# Patient Record
Sex: Female | Born: 1980
Health system: Southern US, Community
[De-identification: ages and names within clinical notes are randomized; demographics above are authoritative.]

## PROBLEM LIST (undated history)

## (undated) DIAGNOSIS — R7303 Prediabetes: Secondary | ICD-10-CM

## (undated) DIAGNOSIS — F419 Anxiety disorder, unspecified: Secondary | ICD-10-CM

## (undated) DIAGNOSIS — K760 Fatty (change of) liver, not elsewhere classified: Secondary | ICD-10-CM

## (undated) DIAGNOSIS — F32A Depression, unspecified: Secondary | ICD-10-CM

## (undated) DIAGNOSIS — O009 Unspecified ectopic pregnancy without intrauterine pregnancy: Secondary | ICD-10-CM

## (undated) DIAGNOSIS — F329 Major depressive disorder, single episode, unspecified: Secondary | ICD-10-CM

## (undated) DIAGNOSIS — E039 Hypothyroidism, unspecified: Secondary | ICD-10-CM

## (undated) DIAGNOSIS — E282 Polycystic ovarian syndrome: Secondary | ICD-10-CM

## (undated) DIAGNOSIS — E119 Type 2 diabetes mellitus without complications: Secondary | ICD-10-CM

## (undated) HISTORY — DX: Anxiety disorder, unspecified: F41.9

## (undated) HISTORY — PX: DILATION AND CURETTAGE OF UTERUS: SHX78

## (undated) HISTORY — DX: Depression, unspecified: F32.A

## (undated) HISTORY — DX: Unspecified ectopic pregnancy without intrauterine pregnancy: O00.90

## (undated) HISTORY — DX: Type 2 diabetes mellitus without complications: E11.9

## (undated) HISTORY — DX: Polycystic ovarian syndrome: E28.2

## (undated) HISTORY — DX: Hypothyroidism, unspecified: E03.9

## (undated) HISTORY — DX: Prediabetes: R73.03

## (undated) HISTORY — DX: Major depressive disorder, single episode, unspecified: F32.9

---

## 2014-07-23 DIAGNOSIS — O009 Unspecified ectopic pregnancy without intrauterine pregnancy: Secondary | ICD-10-CM

## 2014-07-23 HISTORY — DX: Unspecified ectopic pregnancy without intrauterine pregnancy: O00.90

## 2014-08-30 ENCOUNTER — Observation Stay: Payer: Self-pay | Admitting: Obstetrics & Gynecology

## 2014-08-30 LAB — URINALYSIS, COMPLETE
Bacteria: NONE SEEN
Bilirubin,UR: NEGATIVE
GLUCOSE, UR: NEGATIVE mg/dL (ref 0–75)
Ketone: NEGATIVE
LEUKOCYTE ESTERASE: NEGATIVE
NITRITE: NEGATIVE
PROTEIN: NEGATIVE
Ph: 6 (ref 4.5–8.0)
SPECIFIC GRAVITY: 1.014 (ref 1.003–1.030)
Squamous Epithelial: <1
WBC UR: <1 /HPF (ref 0–5)

## 2014-08-30 LAB — CBC
HCT: 43.2 % (ref 35.0–47.0)
HGB: 14.1 g/dL (ref 12.0–16.0)
MCH: 28.9 pg (ref 26.0–34.0)
MCHC: 32.6 g/dL (ref 32.0–36.0)
MCV: 89 fL (ref 80–100)
PLATELETS: 236 10*3/uL (ref 150–440)
RBC: 4.87 10*6/uL (ref 3.80–5.20)
RDW: 13.7 % (ref 11.5–14.5)
WBC: 12.6 10*3/uL — AB (ref 3.6–11.0)

## 2014-08-30 LAB — COMPREHENSIVE METABOLIC PANEL
ALBUMIN: 4 g/dL (ref 3.4–5.0)
ANION GAP: 7 (ref 7–16)
AST: 32 U/L (ref 15–37)
Alkaline Phosphatase: 68 U/L (ref 46–116)
BILIRUBIN TOTAL: 0.5 mg/dL (ref 0.2–1.0)
BUN: 10 mg/dL (ref 7–18)
CHLORIDE: 106 mmol/L (ref 98–107)
CREATININE: 1.06 mg/dL (ref 0.60–1.30)
Calcium, Total: 8.9 mg/dL (ref 8.5–10.1)
Co2: 24 mmol/L (ref 21–32)
EGFR (Non-African Amer.): >60
Glucose: 90 mg/dL (ref 65–99)
Osmolality: 272 (ref 275–301)
POTASSIUM: 3.8 mmol/L (ref 3.5–5.1)
SGPT (ALT): 38 U/L (ref 14–63)
Sodium: 137 mmol/L (ref 136–145)
TOTAL PROTEIN: 7.7 g/dL (ref 6.4–8.2)

## 2014-08-30 LAB — WET PREP, GENITAL

## 2014-08-30 LAB — HCG, QUANTITATIVE, PREGNANCY: Beta Hcg, Quant.: 2086 m[IU]/mL — ABNORMAL HIGH

## 2014-11-15 LAB — SURGICAL PATHOLOGY

## 2014-11-21 NOTE — Op Note (Signed)
PATIENT NAME:  Emily Banks, Emily Banks MR#:  [id number] DATE OF BIRTH:  08/25/1980  DATE OF PROCEDURE:  08/30/2014  PREOPERATIVE DIAGNOSIS:  Ectopic pregnancy.   POSTOPERATIVE DIAGNOSIS:  Ectopic pregnancy.  PROCEDURE:  Operative laparoscopy and right salpingectomy.   SURGEON:  Emily Self C. Laycie Schriner, MD   ANESTHESIA:  General endotracheal.  ESTIMATED BLOOD LOSS:  Minimal.   OPERATIVE FLUIDS:  600 mL of crystalloid.   URINE OUTPUT:  100 mL.   FINDINGS: 1.  Dilated cervical os with blood clot and tissue within, which was removed and sent to pathology.  2.  Normal-appearing uterus, bilateral ovaries, and left tube.  3.  Swelling of the right fallopian tube, mid-isthmus, consistent with ectopic pregnancy, intact.  4.  Adhesive disease in the pelvis and abdomen with omentum adherent to posterior cul-de-sac, ascending colon with filmy adhesions to the right lateral abdominal wall, and sigmoid colon with filmy adhesions to the left pelvic sidewall greater than normal physiologic.   SPECIMENS: 1.  Right tube with ectopic pregnancy.  2.  Cervical blood clot/tissue.   COMPLICATIONS:  None apparent.   DISPOSITION:  Stable to PACU for recovery.   INDICATION FOR PROCEDURE:  Emily Banks is a 34 year old G1, P0-0-0-0, whose last menstrual period was in June 25, 2014, which made her 9 weeks and 3 days by dates today. She was evaluated for initial pregnancy, vaginal bleeding and pelvic pain and over the course of the last 2 weeks was having her beta-hCG monitored. At first, the beta-hCG declined, but then it began to rise inappropriately. Today's value was 2086. She presented to the hospital with pelvic pain and ultrasound was performed. There was no definitive visualization on ultrasound of a gestational sac, fetal pole, or any confirmatory pregnancy findings in the adnexa or in the uterus. There was an abnormality in the right adnexa that was concerning for ruptured ectopic. She was counseled  extensively regarding observation versus methotrexate treatment versus surgery and ultimately surgery was recommended given her clinical scenario, and it was discussed with her that the attempt would be made to conserve the tube if possible; however, the possibility of taking the tube to avoid future scar tissue and ectopic pregnancies would be considered at the time of surgery.   Consents were signed, and the patient was brought back to the operating room, where she was given endotracheal intubation for anesthesia and prepped and draped in the usual sterile fashion. A timeout was called. The Graves speculum was placed into the vagina, visualizing the cervix with findings as above. The uterine manipulator was then inserted, and straight catheterization was performed to evacuate the bladder. The attention was then turned to the abdomen, where an 11 blade was used to incise the apex of the umbilicus, and a Veress needle was inserted. Pneumoperitoneum was created. Opening pressure was 7 mmHg and the max pressure was 15 mmHg throughout the case. The Veress needle was then removed once the pneumoperitoneum was created, and the 5 mm trocar Visiport was inserted into the same area.   Inspection of the abdominal cavity showed findings as above. A 5 mm port was then placed in each, the left lower quadrant and the right lower quadrant under visualization and without difficulty. The patient was placed in Trendelenburg. The omentum was attempted to be removed out of the pelvis; however, it was adherent on the left side of the posterior cul-de-sac, and no attempt was made to release these adhesions. On inspection, the uterus was normal-appearing. The bilateral ovaries were also normal-appearing  and plump and appropriately sized for a 34 year old normal-menstruating female. The left tube appeared to be normal, and then the right tube had a normal fimbriated end with blood coming through it and a blood clot that had formed  against the omentum that was adherent near that area. Then, within the tube isthmus, there was a dilated area consistent with an ectopic pregnancy in the fallopian tube. Cautery was used to incise the tube in a longitudinal fashion in order to attempt to tease out the gestational sac or the sac that had been identified in the tubular structure. The majority of the sac was able to be expelled; however, on final attempts to remove it from the tube, the distal end of the tube inverted and became quite disrupted. The sac itself also became disrupted once pressure had been applied and was removed from the abdominal cavity no longer intact. This tissue was collected, and the inspection of the tube was such that attempt to heal intact would likely not be possible and would be a nidus for future ectopic pregnancy. Thus, I made the decision to remove the tube in its entirety. The tube was grasped at its fimbriated end and lifted, and the mesosalpinx beneath it was ligated using the LigaSure towards the cornua. Then, it was sealed at the cornual end and then removed from the abdominal cavity intact. This was placed together with the ectopic tissue and sent off together as right tube and ectopic pregnancy. The area was hemostatic. Irrigation was performed to remove the small amount of hematoperitoneum that had already been in place, as well as the blood clot near the omentum, and the procedure was deemed complete. The laparoscopic trocars were removed after the pneumoperitoneum was deflated. The skin was then closed with 4-0 Monocryl and covered in surgical glue. The uterine manipulator was removed, and the cervical tissue was collected and sent to pathology for review to ensure that this was indeed just a blood clot and not additional pregnancy tissue.   The patient tolerated the procedure well. The counts were correct x 2, and then the patient was brought back to PACU in a stable condition for recovery.     ____________________________ Emily Banks C. Raywood Wailes, MD ccw:nb D: 08/31/2014 01:17:18 ET T: 08/31/2014 05:47:38 ET JOB#: [id number]  cc: Leeroy Bockhelsea C. Nasim Garofano, MD, <Dictator> Leola BrazilHELSEA C Khamiya Varin MD ELECTRONICALLY SIGNED 08/31/2014 12:42

## 2016-01-31 ENCOUNTER — Encounter: Payer: Self-pay | Admitting: Primary Care

## 2016-01-31 ENCOUNTER — Ambulatory Visit (INDEPENDENT_AMBULATORY_CARE_PROVIDER_SITE_OTHER): Payer: Managed Care, Other (non HMO) | Admitting: Primary Care

## 2016-01-31 VITALS — BP 124/84 | HR 64 | Temp 98.7°F | Ht 66.0 in | Wt 255.1 lb

## 2016-01-31 DIAGNOSIS — F418 Other specified anxiety disorders: Secondary | ICD-10-CM

## 2016-01-31 DIAGNOSIS — R030 Elevated blood-pressure reading, without diagnosis of hypertension: Secondary | ICD-10-CM

## 2016-01-31 DIAGNOSIS — F32A Depression, unspecified: Secondary | ICD-10-CM | POA: Insufficient documentation

## 2016-01-31 DIAGNOSIS — F329 Major depressive disorder, single episode, unspecified: Secondary | ICD-10-CM | POA: Insufficient documentation

## 2016-01-31 DIAGNOSIS — F419 Anxiety disorder, unspecified: Principal | ICD-10-CM

## 2016-01-31 HISTORY — DX: Elevated blood-pressure reading, without diagnosis of hypertension: R03.0

## 2016-01-31 MED ORDER — SERTRALINE HCL 25 MG PO TABS: 25.0000 mg | ORAL_TABLET | Freq: Every day | ORAL | Status: DC

## 2016-01-31 NOTE — Assessment & Plan Note (Signed)
Long history of for 2 years since miscarriage. Depression and anxiety attributed to difficulty conceiving children due to PCOS. Long discussion today regarding treatment options and highly recommend both oral and psychotherapy treatment as pHQ 9 score is 19 and GAD 7 score is 17 today.  Since she is not currently pregnant I believe she will benefit with the initiation of an oral medication. Referral placed for therapy. Start Zoloft 25 mg tablets. Discussed risks for birth defects which are low in the first and second trimester. She will notify me and her GYN if she becomes pregnant.  Patient is to take 1/2 tablet daily for 6 days, then advance to 1 full tablet thereafter. We discussed possible side effects of headache, GI upset, drowsiness, and SI/HI. If thoughts of SI/HI develop, we discussed to present to the emergency immediately. Patient verbalized understanding.   Follow-up in 6 weeks for reevaluation.

## 2016-01-31 NOTE — Progress Notes (Signed)
Pre visit review using our clinic review tool, if applicable. No additional management support is needed unless otherwise documented below in the visit note. 

## 2016-01-31 NOTE — Assessment & Plan Note (Signed)
1 elevated reading in April 2017. Has not checked her blood pressure since. Low pressure during today's visit is stable at 124/87. She is asymptomatic. Given family history and obesity will continue to monitor.

## 2016-01-31 NOTE — Patient Instructions (Signed)
Check with your GYN prior to starting Zoloft. I recommend treatment with this medication for now as you are not pregnant, and I believe it will help with your symptoms.  Start by taking 1/2 tablet daily for 8 days, then advance to 1 full tablet thereafter.  You will be contacted regarding your referral to Therapy.  Please let us know if you have not heard back within one week.   Your blood pressure looks great today.  I would like to see you back in 6 weeks for re-evaluation.  It was a pleasure to meet you today! Please don't hesitate to call me with any questions. Welcome to [facility]!

## 2016-01-31 NOTE — Progress Notes (Signed)
Subjective:    Patient ID: Emily HorsemanJennifer Banks, female    DOB: 16-Jul-1981, 35 y.o.   MRN: [id number]  HPI  Ms. Emily Banks is a 35 year old female who presents today to establish care and discuss the problems mentioned below. Will obtain old records.  1) Elevated Blood Pressure Reading: Elevated once at her optometrists office several months ago. BP is stable in the office today at 124/84. She's not checked her blood pressure since her optometrist visit in April. Strong family history of hypertension. Denies headaches, visual changes, dizziness, chest pain.   2) Anxiety and Depression: History of miscarriage 2 years ago and has struggled with depressive symptoms since. She is trying to become pregnant now and is working with GYN. She has a history of PCOS which has caused complications in becoming pregnant.   She experiences symptoms of fatigue, tearfulness with depressive emotions daily, has difficulty falling asleep, experiences daily worry and daily anxiety. She has a history of panic attacks in large crowds that occur once monthly. PHQ 9 score of 19 and GAD 7 score 17. Denies SI/HI. She has never been treated with medication and has never completed therapy.  Review of Systems  Eyes: Negative for visual disturbance.  Respiratory: Negative for shortness of breath.   Cardiovascular: Negative for chest pain.  Allergic/Immunologic: Positive for environmental allergies.  Neurological: Negative for dizziness and headaches.  Psychiatric/Behavioral: Positive for sleep disturbance. Negative for suicidal ideas. The patient is nervous/anxious.        Past Medical History  Diagnosis Date  . Anxiety and depression   . PCOS (polycystic ovarian syndrome)   . Borderline diabetes      Social History   Social History  . Marital Status: Married    Spouse Name: N/A  . Number of Children: N/A  . Years of Education: N/A   Occupational History  . Not on file.   Social History Main Topics  .  Smoking status: Former Games developermoker  . Smokeless tobacco: Not on file  . Alcohol Use: 0.0 oz/week    0 Standard drinks or equivalent per week     Comment: soical  . Drug Use: Not on file  . Sexual Activity: Not on file   Other Topics Concern  . Not on file   Social History Narrative   Married.   No children.   Works at [employer].   Enjoys playing games, spending time with family.    Past Surgical History  Procedure Laterality Date  . Dilation and curettage of uterus      Family History  Problem Relation Age of Onset  . Diabetes Father   . Hypertension Father   . Heart disease Father   . Hyperlipidemia Mother   . Diabetes Brother   . Hypertension Brother   . Melanoma Father   . Ovarian cancer Paternal Aunt   . Esophageal cancer Maternal Grandmother     No Known Allergies  No current outpatient prescriptions on file prior to visit.   No current facility-administered medications on file prior to visit.    BP 124/84 mmHg  Pulse 64  Temp(Src) 98.7 F (37.1 C) (Oral)  Ht 5\' 6"  (1.676 m)  Wt 255 lb 1.9 oz (115.722 kg)  BMI 41.20 kg/m2  SpO2 98%    Objective:   Physical Exam  Constitutional: She appears well-nourished.  Neck: Neck supple.  Cardiovascular: Normal rate and regular rhythm.   Pulmonary/Chest: Effort normal and breath sounds normal.  Skin: Skin is warm and  dry.  Psychiatric: She has a normal mood and affect.  Slightly tearful during examination when discussing depression and anxiety. Depression and anxiety uncontrolled, needs treatment.          Assessment & Plan:

## 2016-03-13 ENCOUNTER — Ambulatory Visit: Payer: Managed Care, Other (non HMO) | Admitting: Primary Care

## 2016-03-13 ENCOUNTER — Telehealth: Payer: Self-pay | Admitting: Primary Care

## 2016-03-13 DIAGNOSIS — Z0289 Encounter for other administrative examinations: Secondary | ICD-10-CM

## 2016-03-13 NOTE — Telephone Encounter (Signed)
Please reschedule patient at her convenience.

## 2016-03-13 NOTE — Telephone Encounter (Signed)
Left message for pt to reschedule appt that was missed

## 2016-03-13 NOTE — Telephone Encounter (Signed)
Patient did not come in for their appointment today for 6 week follow up.  Please let me know if patient needs to be contacted immediately for follow up or no follow up needed. °

## 2016-03-21 ENCOUNTER — Encounter: Payer: Self-pay | Admitting: Primary Care

## 2016-03-21 NOTE — Telephone Encounter (Signed)
Sent letter to pt to rs appt  °

## 2016-03-25 ENCOUNTER — Other Ambulatory Visit: Payer: Self-pay | Admitting: Primary Care

## 2016-03-25 DIAGNOSIS — F329 Major depressive disorder, single episode, unspecified: Secondary | ICD-10-CM

## 2016-03-25 DIAGNOSIS — F419 Anxiety disorder, unspecified: Principal | ICD-10-CM

## 2016-03-25 DIAGNOSIS — F32A Depression, unspecified: Secondary | ICD-10-CM

## 2016-03-27 NOTE — Telephone Encounter (Signed)
Electronic refill request.  NP on 01/31/16.  No Showed for FU on 03/13/16.  Please advise concerning refill.

## 2016-03-28 NOTE — Telephone Encounter (Signed)
Sent.  Needs to schedule f/u.

## 2016-04-02 ENCOUNTER — Encounter: Payer: Self-pay | Admitting: Primary Care

## 2016-05-07 ENCOUNTER — Other Ambulatory Visit: Payer: Self-pay | Admitting: Family Medicine

## 2016-05-07 DIAGNOSIS — F32A Depression, unspecified: Secondary | ICD-10-CM

## 2016-05-07 DIAGNOSIS — F419 Anxiety disorder, unspecified: Principal | ICD-10-CM

## 2016-05-07 DIAGNOSIS — F329 Major depressive disorder, single episode, unspecified: Secondary | ICD-10-CM

## 2016-05-09 ENCOUNTER — Ambulatory Visit (INDEPENDENT_AMBULATORY_CARE_PROVIDER_SITE_OTHER): Payer: Managed Care, Other (non HMO) | Admitting: Primary Care

## 2016-05-09 ENCOUNTER — Encounter: Payer: Self-pay | Admitting: Primary Care

## 2016-05-09 VITALS — BP 122/78 | HR 50 | Temp 98.2°F | Ht 66.0 in | Wt 262.4 lb

## 2016-05-09 DIAGNOSIS — F418 Other specified anxiety disorders: Secondary | ICD-10-CM

## 2016-05-09 DIAGNOSIS — F32A Depression, unspecified: Secondary | ICD-10-CM

## 2016-05-09 DIAGNOSIS — F329 Major depressive disorder, single episode, unspecified: Secondary | ICD-10-CM

## 2016-05-09 DIAGNOSIS — F419 Anxiety disorder, unspecified: Principal | ICD-10-CM

## 2016-05-09 MED ORDER — SERTRALINE HCL 50 MG PO TABS: 50.0000 mg | ORAL_TABLET | Freq: Every day | ORAL | 1 refills | Status: DC

## 2016-05-09 NOTE — Progress Notes (Signed)
   Subjective:    Patient ID: Emily HorsemanJennifer Bebout, female    DOB: 07-Jul-1981, 35 y.o.   MRN: [id number]  HPI  [name] is a 35 year old female who presents today for follow up of anxiety and depression. She presented as a new patient in July 2017 with complaints of depressive symptoms since her miscarriages and given her inability to become pregnant. Her PHQ 9 score was 19 and GAD 7 score of 17 that day. She was initiated on Zoloft 25 mg and sent to therapy for further treatment.   Since her last visit she's noticed improvement overall on Zoloft, she's not scheduled a visit with therapy. She's noticed positive effects from Zoloft including  decrease in anxiety and improvement in her mood. She does continues to experience some anxiety, sadness, and tearfulness. She believes she would benefit from an increase in her dose. She denies suicidal thoughts, GI upset, headaches.    Review of Systems  Respiratory: Negative for shortness of breath.   Cardiovascular: Negative for chest pain.  Gastrointestinal: Negative for nausea.  Psychiatric/Behavioral: Negative for sleep disturbance and suicidal ideas. The patient is nervous/anxious.        Depression       Past Medical History:  Diagnosis Date  . Anxiety and depression   . Borderline diabetes   . PCOS (polycystic ovarian syndrome)      Social History   Social History  . Marital status: Married    Spouse name: N/A  . Number of children: N/A  . Years of education: N/A   Occupational History  . Not on file.   Social History Main Topics  . Smoking status: Former Games developermoker  . Smokeless tobacco: Not on file  . Alcohol use 0.0 oz/week     Comment: soical  . Drug use: Unknown  . Sexual activity: Not on file   Other Topics Concern  . Not on file   Social History Narrative   Married.   No children.   Works at [employer].   Enjoys playing games, spending time with family.    Past Surgical History:  Procedure Laterality Date    . DILATION AND CURETTAGE OF UTERUS      Family History  Problem Relation Age of Onset  . Diabetes Father   . Hypertension Father   . Heart disease Father   . Hyperlipidemia Mother   . Diabetes Brother   . Hypertension Brother   . Melanoma Father   . Ovarian cancer Paternal Aunt   . Esophageal cancer Maternal Grandmother     No Known Allergies  No current outpatient prescriptions on file prior to visit.   No current facility-administered medications on file prior to visit.     BP 122/78   Pulse (!) 50   Temp 98.2 F (36.8 C) (Oral)   Ht 5\' 6"  (1.676 m)   Wt 262 lb 6.4 oz (119 kg)   LMP 05/07/2016   SpO2 98%   BMI 42.35 kg/m    Objective:   Physical Exam  Constitutional: She appears well-nourished.  Cardiovascular: Normal rate and regular rhythm.   Pulmonary/Chest: Effort normal and breath sounds normal.  Skin: Skin is warm and dry.  Psychiatric: She has a normal mood and affect.  Much improved mood since last visit          Assessment & Plan:

## 2016-05-09 NOTE — Assessment & Plan Note (Signed)
Improved overall on Zoloft 25 mg, does experience symptoms of anxiety and depression. Will increase dose to 50 mg once daily. Cautioned her to notify her GYN if she does become pregnant. Recommended she follow with therapy as initially recommended.

## 2016-05-09 NOTE — Patient Instructions (Signed)
We've increased your dose of Zoloft to 50 mg. Take 1 tablet by mouth once daily.   You received a tetanus vaccination which will cover you for 10 years.  I will be in touch with you in 4 weeks to catch up.  It was a pleasure to see you today!

## 2016-05-09 NOTE — Progress Notes (Signed)
Pre visit review using our clinic review tool, if applicable. No additional management support is needed unless otherwise documented below in the visit note. 

## 2016-06-06 ENCOUNTER — Telehealth: Payer: Self-pay | Admitting: Primary Care

## 2016-06-06 NOTE — Telephone Encounter (Signed)
-----   Message from Doreene NestKatherine K Channon Ambrosini, NP sent at 05/09/2016  2:56 PM EDT ----- Regarding: Anxiety and Depression Please check on patient. How's she doing since we increased her Zoloft to 50 mg?

## 2016-06-06 NOTE — Telephone Encounter (Signed)
Message left for patient to return my call.  

## 2016-06-07 NOTE — Telephone Encounter (Signed)
Left message on patient's voicemail to return call

## 2016-06-12 NOTE — Telephone Encounter (Signed)
Sent patient a message thru MyChart 

## 2016-06-18 NOTE — Telephone Encounter (Signed)
Message left for patient to return my call.  

## 2016-12-04 ENCOUNTER — Other Ambulatory Visit: Payer: Self-pay | Admitting: Primary Care

## 2016-12-04 DIAGNOSIS — F419 Anxiety disorder, unspecified: Principal | ICD-10-CM

## 2016-12-04 DIAGNOSIS — F329 Major depressive disorder, single episode, unspecified: Secondary | ICD-10-CM

## 2017-03-13 ENCOUNTER — Other Ambulatory Visit: Payer: Self-pay | Admitting: Primary Care

## 2017-03-13 DIAGNOSIS — F329 Major depressive disorder, single episode, unspecified: Secondary | ICD-10-CM

## 2017-03-13 DIAGNOSIS — F419 Anxiety disorder, unspecified: Principal | ICD-10-CM

## 2017-03-13 NOTE — Telephone Encounter (Signed)
Last refill from 12/04/16... Pt never returned call or email giving update... Did you want to try to contact pt again or refill? Please advise

## 2017-03-13 NOTE — Telephone Encounter (Signed)
Message left for patient to return my call.  

## 2017-03-13 NOTE — Telephone Encounter (Signed)
No refill. Please have her schedule an appointment for refills since we never heard back from her last year.

## 2017-03-18 NOTE — Telephone Encounter (Signed)
Message left for patient to return my call.  Sending letter for patient requesting office visit for any more refills

## 2017-04-09 ENCOUNTER — Ambulatory Visit (INDEPENDENT_AMBULATORY_CARE_PROVIDER_SITE_OTHER): Payer: Managed Care, Other (non HMO) | Admitting: Primary Care

## 2017-04-09 ENCOUNTER — Encounter: Payer: Self-pay | Admitting: Primary Care

## 2017-04-09 DIAGNOSIS — R030 Elevated blood-pressure reading, without diagnosis of hypertension: Secondary | ICD-10-CM

## 2017-04-09 DIAGNOSIS — G5603 Carpal tunnel syndrome, bilateral upper limbs: Secondary | ICD-10-CM | POA: Diagnosis not present

## 2017-04-09 DIAGNOSIS — G5600 Carpal tunnel syndrome, unspecified upper limb: Secondary | ICD-10-CM

## 2017-04-09 DIAGNOSIS — F419 Anxiety disorder, unspecified: Secondary | ICD-10-CM | POA: Diagnosis not present

## 2017-04-09 DIAGNOSIS — F329 Major depressive disorder, single episode, unspecified: Secondary | ICD-10-CM | POA: Diagnosis not present

## 2017-04-09 HISTORY — DX: Carpal tunnel syndrome, unspecified upper limb: G56.00

## 2017-04-09 MED ORDER — SERTRALINE HCL 50 MG PO TABS: 50.0000 mg | ORAL_TABLET | Freq: Every day | ORAL | 3 refills | Status: DC

## 2017-04-09 NOTE — Assessment & Plan Note (Signed)
Doing well on Zoloft, denies SI/HI. Refills sent to pharmacy.

## 2017-04-09 NOTE — Patient Instructions (Signed)
Continue sertraline (Zoloft) 50 mg tablets.  Try the "cock-up" splints when typing. Consider wearing braces at night to prevent compression and to offer support.  Follow up in 1 year for re-evaluation.  It was a pleasure to see you today!

## 2017-04-09 NOTE — Assessment & Plan Note (Signed)
Highly suspect carpal tunnel to be etiology of symptoms. Discussed use of cock up splints, braces at night. Continue to monitor.

## 2017-04-09 NOTE — Progress Notes (Signed)
Subjective:    Patient ID: Emily Banks, female    DOB: 1981/06/27, 36 y.o.   MRN: [id number]  HPI  Ms. Brunelli is a 36 year old female with a history of anxiety and depression who presents today for medication refill.  She is currently managed on sertraline 50 mg for which she's been taking since July 2017. She has continued to feel very well on Zoloft since initiation. She's continued to notice feeling more calm during stressful situations, significantly less tearfulness, feeling less depressed. She denies SI/HI, headaches, nausea, GI upset.   2) Extremity Numbness: Located to bilateral upper extremities that starts to the fingers and hands, through the wrists with radiation up to the upper forearm. This has been present for several months. She works typing on a computer all day, also works in Estate agent and uses an Statistician. She will wake up in the morning with moderate numbness with discomfort. She denies neck and shoulder pain, weakness, color changes to skin.  Review of Systems  Musculoskeletal: Negative for arthralgias and neck pain.  Skin: Negative for color change.  Neurological: Positive for numbness. Negative for weakness.  Psychiatric/Behavioral: Negative for suicidal ideas. The patient is not nervous/anxious.        See HPI       Past Medical History:  Diagnosis Date  . Anxiety and depression   . Borderline diabetes   . PCOS (polycystic ovarian syndrome)      Social History   Social History  . Marital status: Married    Spouse name: N/A  . Number of children: N/A  . Years of education: N/A   Occupational History  . Not on file.   Social History Main Topics  . Smoking status: Former Games developer  . Smokeless tobacco: Never Used  . Alcohol use 0.0 oz/week     Comment: soical  . Drug use: Unknown  . Sexual activity: Not on file   Other Topics Concern  . Not on file   Social History Narrative   Married.   No children.   Works at [employer].   Enjoys playing games, spending time with family.    Past Surgical History:  Procedure Laterality Date  . DILATION AND CURETTAGE OF UTERUS      Family History  Problem Relation Age of Onset  . Diabetes Father   . Hypertension Father   . Heart disease Father   . Hyperlipidemia Mother   . Diabetes Brother   . Hypertension Brother   . Melanoma Father   . Ovarian cancer Paternal Aunt   . Esophageal cancer Maternal Grandmother     No Known Allergies  No current outpatient prescriptions on file prior to visit.   No current facility-administered medications on file prior to visit.     BP 118/74   Pulse 67   Temp 98 F (36.7 C) (Oral)   Ht  (1.676 m)   Wt 273 lb 12.8 oz (124.2 kg)   SpO2 97%   BMI 44.19 kg/m    Objective:   Physical Exam  Constitutional: She appears well-nourished.  Neck: Neck supple.  Cardiovascular: Normal rate and regular rhythm.   Pulmonary/Chest: Effort normal and breath sounds normal.  Musculoskeletal:       Right shoulder: She exhibits normal range of motion and no pain.       Left shoulder: She exhibits normal range of motion and no pain.       Cervical back: She exhibits normal range  of motion.  5/5 strength to bilateral upper extremities.  Neurological:  Negative phalen's and tinnel's sign.  Skin: Skin is warm and dry. No erythema.          Assessment & Plan:

## 2017-04-09 NOTE — Assessment & Plan Note (Signed)
BP stable in the office today. Continue to monitor.

## 2017-09-03 ENCOUNTER — Encounter: Payer: Self-pay | Admitting: Primary Care

## 2017-09-03 ENCOUNTER — Ambulatory Visit: Payer: 59 | Admitting: Primary Care

## 2017-09-03 VITALS — BP 118/80 | HR 78 | Temp 98.0°F | Ht 66.0 in | Wt 278.1 lb

## 2017-09-03 DIAGNOSIS — F329 Major depressive disorder, single episode, unspecified: Secondary | ICD-10-CM

## 2017-09-03 DIAGNOSIS — M79662 Pain in left lower leg: Secondary | ICD-10-CM | POA: Diagnosis not present

## 2017-09-03 DIAGNOSIS — F419 Anxiety disorder, unspecified: Secondary | ICD-10-CM

## 2017-09-03 LAB — VITAMIN B12: Vitamin B-12: 332 pg/mL (ref 211–911)

## 2017-09-03 LAB — TSH: TSH: 3.68 u[IU]/mL (ref 0.35–4.50)

## 2017-09-03 LAB — BASIC METABOLIC PANEL
BUN: 10 mg/dL (ref 6–23)
CHLORIDE: 104 meq/L (ref 96–112)
CO2: 25 mEq/L (ref 19–32)
CREATININE: 1 mg/dL (ref 0.40–1.20)
Calcium: 9.2 mg/dL (ref 8.4–10.5)
GFR: 66.35 mL/min (ref >60.00)
Glucose, Bld: 92 mg/dL (ref 70–99)
Potassium: 4.1 mEq/L (ref 3.5–5.1)
Sodium: 138 mEq/L (ref 135–145)

## 2017-09-03 MED ORDER — SERTRALINE HCL 100 MG PO TABS: 100.0000 mg | ORAL_TABLET | Freq: Every day | ORAL | 0 refills | Status: DC

## 2017-09-03 NOTE — Progress Notes (Signed)
Subjective:    Patient ID: Emily Banks, female    DOB: [birth year], 37 y.o.   MRN: [id number]  HPI  Emily Banks is a 37 year old female with a history of anxiety and depression, carpal tunnel syndrome, obesity who presents today with multiple complaints.  1) Lower Extremity Pain: Located to left lateral lower leg from the upper calf with radiation down to her left foot. Pain is intermittent (1-2 times monthly) and will occur mostly when applying weight such as walking, walking upstairs, or turning. She denies swelling, redness, recent injury or trauma, long travel, back pain, numbness/tingling. She describes her pain as a sharp, shooting pain with weakness that will last for seconds to minutes. Symptoms have been present for the past one year and seems like symptoms are more frequent. She does not smoke, she is not on birth control. She does not exercise.   2) Anxiety: Currently managed on Zoloft 50 mg which "takes the edge off". Over the past one year she feels this "rush" of anxiety that she'll feel to her throat. This will last a few seconds then will dissipate. Symptoms are very random and will occur with rest and mild exertion. She denies burning, belching, epigastric pain, chest pain.   GAD 7 score of 17 today. She denies SI/HI.   Review of Systems  Constitutional: Negative for fever and unexpected weight change.  HENT: Negative for trouble swallowing.   Musculoskeletal: Negative for back pain.       Left lower extremity pain  Neurological: Negative for numbness.  Psychiatric/Behavioral: The patient is nervous/anxious.        See HPI       Past Medical History:  Diagnosis Date  . Anxiety and depression   . Borderline diabetes   . PCOS (polycystic ovarian syndrome)      Social History   Socioeconomic History  . Marital status: Married    Spouse name: Not on file  . Number of children: Not on file  . Years of education: Not on file  . Highest education level: Not  on file  Social Needs  . Financial resource strain: Not on file  . Food insecurity - worry: Not on file  . Food insecurity - inability: Not on file  . Transportation needs - medical: Not on file  . Transportation needs - non-medical: Not on file  Occupational History  . Not on file  Tobacco Use  . Smoking status: Former Games developer  . Smokeless tobacco: Never Used  Substance and Sexual Activity  . Alcohol use: Yes    Alcohol/week: 0.0 oz    Comment: soical  . Drug use: Not on file  . Sexual activity: Not on file  Other Topics Concern  . Not on file  Social History Narrative   Married.   No children.   Works at [employer].   Enjoys playing games, spending time with family.     Family History  Problem Relation Age of Onset  . Diabetes Father   . Hypertension Father   . Heart disease Father   . Hyperlipidemia Mother   . Diabetes Brother   . Hypertension Brother   . Melanoma Father   . Ovarian cancer Paternal Aunt   . Esophageal cancer Maternal Grandmother     No Known Allergies  Current Outpatient Medications on File Prior to Visit  Medication Sig Dispense Refill  . sertraline (ZOLOFT) 50 MG tablet Take 1 tablet (50 mg total) by mouth daily. 90 tablet 3  No current facility-administered medications on file prior to visit.     BP 118/80   Pulse 78   Temp 98 F (36.7 C) (Oral)   Ht 5\' 6"  (1.676 m)   Wt 278 lb 1.9 oz (126.2 kg)   SpO2 97%   BMI 44.89 kg/m    Objective:   Physical Exam  Constitutional: She appears well-nourished.  Neck: Neck supple.  Cardiovascular: Normal rate and regular rhythm.  Pulmonary/Chest: Effort normal and breath sounds normal.  Musculoskeletal:       Legs: No tenderness to left lower extremity   Skin: Skin is warm and dry. No erythema.  Psychiatric: She has a normal mood and affect.          Assessment & Plan:  Calf Pain:  Intermittent for years. No evidence or risk factors for DVT, exam today not consistent for  DVT. Given duration of symptoms will rule out DVT with ultrasound. Also check labs including BMP and B12. Discussed proper water consumption.  Doreene NestKatherine K Georgeann Brinkman, NP

## 2017-09-03 NOTE — Assessment & Plan Note (Signed)
GAD 7 score of 17 today, Zoloft seems to be helping but perhaps not at the correct dose. Increase Zoloft to 100 mg, new Rx sent to pharmacy.  She will update in 4 weeks. Avoid benzos as this is not best practice for daily anxiety symptoms.

## 2017-09-03 NOTE — Patient Instructions (Signed)
We've increased your sertraline (Zoloft) from 50 mg to 100 mg. You may take two of your 50 mg tablets to equal 100 mg until your bottle is empty.  Please send me an update in 4 weeks via My Chart.  You will be contacted regarding your ultrasound.  Please let us know if you have not been contacted within one week.   Stop by the lab prior to leaving today. I will notify you of your results once received.   It was a pleasure to see you today!

## 2017-09-10 ENCOUNTER — Ambulatory Visit
Admission: RE | Admit: 2017-09-10 | Discharge: 2017-09-10 | Disposition: A | Discharge status: Home / Self Care | Payer: 59 | Source: Ambulatory Visit | Attending: Primary Care | Admitting: Primary Care

## 2017-09-10 DIAGNOSIS — M79662 Pain in left lower leg: Secondary | ICD-10-CM | POA: Diagnosis not present

## 2017-10-07 ENCOUNTER — Other Ambulatory Visit: Payer: Self-pay | Admitting: Primary Care

## 2017-10-07 DIAGNOSIS — F329 Major depressive disorder, single episode, unspecified: Secondary | ICD-10-CM

## 2017-10-07 DIAGNOSIS — F419 Anxiety disorder, unspecified: Principal | ICD-10-CM

## 2018-02-17 ENCOUNTER — Other Ambulatory Visit: Payer: Self-pay | Admitting: Primary Care

## 2018-02-17 DIAGNOSIS — F329 Major depressive disorder, single episode, unspecified: Secondary | ICD-10-CM

## 2018-02-17 DIAGNOSIS — F419 Anxiety disorder, unspecified: Principal | ICD-10-CM

## 2018-06-23 ENCOUNTER — Other Ambulatory Visit: Payer: Self-pay | Admitting: Primary Care

## 2018-06-23 DIAGNOSIS — F329 Major depressive disorder, single episode, unspecified: Secondary | ICD-10-CM

## 2018-06-23 DIAGNOSIS — F419 Anxiety disorder, unspecified: Principal | ICD-10-CM

## 2018-06-23 NOTE — Telephone Encounter (Signed)
Please call patient: How's she doing on Zoloft 100 mg for anxiety? Also will need office visit follow up for February 2020, please schedule. Okay to do CPE.

## 2018-06-23 NOTE — Telephone Encounter (Signed)
Last office visit 09/03/2017 for calf pain & anxiety.  AVS states to update Emily Banks in 4 weeks with dosage change.  I do not see an update.  No future appointment.  Ok to refill?

## 2018-06-24 ENCOUNTER — Other Ambulatory Visit: Payer: Self-pay | Admitting: Primary Care

## 2018-06-24 DIAGNOSIS — F32A Depression, unspecified: Secondary | ICD-10-CM

## 2018-06-24 DIAGNOSIS — F329 Major depressive disorder, single episode, unspecified: Secondary | ICD-10-CM

## 2018-06-24 DIAGNOSIS — F419 Anxiety disorder, unspecified: Principal | ICD-10-CM

## 2018-06-25 NOTE — Telephone Encounter (Signed)
Message left for patient to return my call.  

## 2018-06-25 NOTE — Telephone Encounter (Deleted)
Message left for patient to return my call.  

## 2018-07-01 NOTE — Telephone Encounter (Signed)
Message left for patient to return my call.  

## 2018-07-18 ENCOUNTER — Other Ambulatory Visit: Payer: Self-pay

## 2018-07-18 DIAGNOSIS — F32A Depression, unspecified: Secondary | ICD-10-CM

## 2018-07-18 DIAGNOSIS — F329 Major depressive disorder, single episode, unspecified: Secondary | ICD-10-CM

## 2018-07-18 DIAGNOSIS — F419 Anxiety disorder, unspecified: Principal | ICD-10-CM

## 2018-07-18 MED ORDER — SERTRALINE HCL 100 MG PO TABS: 100.0000 mg | ORAL_TABLET | Freq: Every day | ORAL | 0 refills | Status: DC

## 2018-07-18 NOTE — Telephone Encounter (Signed)
Send patient a message through MyChart. 

## 2018-08-12 ENCOUNTER — Telehealth: Payer: Self-pay

## 2018-08-12 NOTE — Telephone Encounter (Addendum)
Unable to reach pt at either contact #. Left v/m for pt to cb to [facility]. FYI to Fife at front desk.

## 2018-08-12 NOTE — Telephone Encounter (Signed)
I sent email for pt to call back or schedule via Mychart.

## 2018-08-12 NOTE — Telephone Encounter (Signed)
[facility] Night - Client Nonclinical Telephone Record [facility] Client [facility] Night - Client Client Site  [facility] - Night Physician Vernona Rieger - NP Contact Type Call Who Is Calling Patient / Member / Family / Caregiver Caller Name Briania Gullett Caller Phone Number [phone] Patient Name Emily Banks Patient DOB 30-Jan-1981 Call Type Message Only Information Provided Reason for Call Request to Schedule Office Appointment Initial Comment [name] She needs to talk to her about her carpel tunnel. No symptoms reported and the option to speak with an afterhours nurse was declined. Additional Comment Call Closed By: Majel Homer Transaction Date/Time: 08/11/2018 5:48:08 PM (ET)

## 2018-08-14 ENCOUNTER — Other Ambulatory Visit (INDEPENDENT_AMBULATORY_CARE_PROVIDER_SITE_OTHER): Payer: No Typology Code available for payment source

## 2018-08-14 ENCOUNTER — Encounter: Payer: Self-pay | Admitting: Primary Care

## 2018-08-14 ENCOUNTER — Ambulatory Visit: Payer: No Typology Code available for payment source | Admitting: Primary Care

## 2018-08-14 VITALS — BP 122/84 | HR 72 | Temp 98.6°F | Ht 66.0 in | Wt 280.8 lb

## 2018-08-14 DIAGNOSIS — G5603 Carpal tunnel syndrome, bilateral upper limbs: Secondary | ICD-10-CM | POA: Diagnosis not present

## 2018-08-14 DIAGNOSIS — N289 Disorder of kidney and ureter, unspecified: Secondary | ICD-10-CM

## 2018-08-14 DIAGNOSIS — R030 Elevated blood-pressure reading, without diagnosis of hypertension: Secondary | ICD-10-CM

## 2018-08-14 DIAGNOSIS — R202 Paresthesia of skin: Secondary | ICD-10-CM

## 2018-08-14 DIAGNOSIS — F419 Anxiety disorder, unspecified: Secondary | ICD-10-CM

## 2018-08-14 DIAGNOSIS — F329 Major depressive disorder, single episode, unspecified: Secondary | ICD-10-CM

## 2018-08-14 DIAGNOSIS — R200 Anesthesia of skin: Secondary | ICD-10-CM

## 2018-08-14 LAB — COMPREHENSIVE METABOLIC PANEL
ALBUMIN: 4.5 g/dL (ref 3.5–5.2)
ALT: 49 U/L — ABNORMAL HIGH (ref 0–35)
AST: 33 U/L (ref 0–37)
Alkaline Phosphatase: 69 U/L (ref 39–117)
BUN: 10 mg/dL (ref 6–23)
CALCIUM: 9.4 mg/dL (ref 8.4–10.5)
CHLORIDE: 106 meq/L (ref 96–112)
CO2: 24 meq/L (ref 19–32)
CREATININE: 1.04 mg/dL (ref 0.40–1.20)
GFR: 59.36 mL/min — AB (ref >60.00)
Glucose, Bld: 136 mg/dL — ABNORMAL HIGH (ref 70–99)
POTASSIUM: 4.3 meq/L (ref 3.5–5.1)
Sodium: 137 mEq/L (ref 135–145)
Total Bilirubin: 0.5 mg/dL (ref 0.2–1.2)
Total Protein: 7.4 g/dL (ref 6.0–8.3)

## 2018-08-14 LAB — CBC
HEMATOCRIT: 42.9 % (ref 36.0–46.0)
HEMOGLOBIN: 14.5 g/dL (ref 12.0–15.0)
MCHC: 33.8 g/dL (ref 30.0–36.0)
MCV: 87.4 fl (ref 78.0–100.0)
PLATELETS: 307 10*3/uL (ref 150.0–400.0)
RBC: 4.91 Mil/uL (ref 3.87–5.11)
RDW: 13.4 % (ref 11.5–15.5)
WBC: 9.7 10*3/uL (ref 4.0–10.5)

## 2018-08-14 LAB — HEMOGLOBIN A1C: Hgb A1c MFr Bld: 6.4 % (ref 4.6–6.5)

## 2018-08-14 LAB — VITAMIN B12: VITAMIN B 12: 383 pg/mL (ref 211–911)

## 2018-08-14 MED ORDER — SERTRALINE HCL 100 MG PO TABS: 100.0000 mg | ORAL_TABLET | Freq: Every day | ORAL | 3 refills | Status: DC

## 2018-08-14 NOTE — Progress Notes (Unsigned)
a1c

## 2018-08-14 NOTE — Progress Notes (Signed)
Subjective:    Patient ID: Emily HorsemanJennifer Banks, female    DOB: [birth year], 38 y.o.   MRN: [id number]  HPI  Ms. Emily Banks is a 38 year old female who presents today for follow up.  1) Anxiety and Depression: Currently managed on Zoloft 100 mg daily. Overall doing very well on Zoloft 100 mg daily. She denies SI/HI.   BP Readings from Last 3 Encounters:  08/14/18 122/84  09/03/17 118/80  04/09/17 118/74   2) Carpal Tunnel: Chronic pain with numbness/tingling to bilateral upper extremities, mostly to the bilateral wrists and forearms. This has been present for several years. She will notice a shock like sensation at times, also with some weakness with grasping/sqeezing objects. She's tried wearing cock up splints at night which has helped with numbness/tinlging at night. She cannot wear splints at work due to her job responsibilities and activities.   Over the last one year she's noticed her symptoms are worse, now waking her up at night. She denies neck pain, injury. She's ready for further evaluation.    Review of Systems  Musculoskeletal: Positive for arthralgias.  Skin: Negative for color change.  Neurological: Positive for weakness and numbness.       Past Medical History:  Diagnosis Date  . Anxiety and depression   . Borderline diabetes   . PCOS (polycystic ovarian syndrome)      Social History   Socioeconomic History  . Marital status: Married    Spouse name: Not on file  . Number of children: Not on file  . Years of education: Not on file  . Highest education level: Not on file  Occupational History  . Not on file  Social Needs  . Financial resource strain: Not on file  . Food insecurity:    Worry: Not on file    Inability: Not on file  . Transportation needs:    Medical: Not on file    Non-medical: Not on file  Tobacco Use  . Smoking status: Former Games developermoker  . Smokeless tobacco: Never Used  Substance and Sexual Activity  . Alcohol use: Yes    Alcohol/week:  0.0 standard drinks    Comment: soical  . Drug use: Not on file  . Sexual activity: Not on file  Lifestyle  . Physical activity:    Days per week: Not on file    Minutes per session: Not on file  . Stress: Not on file  Relationships  . Social connections:    Talks on phone: Not on file    Gets together: Not on file    Attends religious service: Not on file    Active member of club or organization: Not on file    Attends meetings of clubs or organizations: Not on file    Relationship status: Not on file  . Intimate partner violence:    Fear of current or ex partner: Not on file    Emotionally abused: Not on file    Physically abused: Not on file    Forced sexual activity: Not on file  Other Topics Concern  . Not on file  Social History Narrative   Married.   No children.   Works at [employer].   Enjoys playing games, spending time with family.      Family History  Problem Relation Age of Onset  . Diabetes Father   . Hypertension Father   . Heart disease Father   . Hyperlipidemia Mother   . Diabetes Brother   . Hypertension  Brother   . Melanoma Father   . Ovarian cancer Paternal Aunt   . Esophageal cancer Maternal Grandmother     No Known Allergies  No current outpatient medications on file prior to visit.   No current facility-administered medications on file prior to visit.     BP 122/84   Pulse 72   Temp 98.6 F (37 C) (Oral)   Ht 5\' 6"  (1.676 m)   Wt 280 lb 12 oz (127.3 kg)   LMP 07/23/2018 (Within Days)   SpO2 97%   BMI 45.31 kg/m    Objective:   Physical Exam  Constitutional: She appears well-nourished.  Neck: Neck supple.  Cardiovascular: Normal rate and regular rhythm.  Pulses:      Radial pulses are 2+ on the right side and 2+ on the left side.  Respiratory: Effort normal and breath sounds normal.  Musculoskeletal:     Right wrist: She exhibits normal range of motion and no tenderness.     Left wrist: She exhibits normal range of  motion and no tenderness.       Arms:  Neurological:  Negative Phalen's/Tineal's signs  Skin: Skin is warm and dry. No erythema.           Assessment & Plan:

## 2018-08-14 NOTE — Assessment & Plan Note (Signed)
Symptoms continue to sounds characteristic of carpal tunnel syndrome.  She does experience some relief with cock up splints but cannot wear them throughout the day. Neuro exam unremarkable. She has great circulation.  Check B12, CBC, BMP today to rule out metabolic cause. Referral placed to hand surgery for evaluation.

## 2018-08-14 NOTE — Patient Instructions (Signed)
Stop by the lab prior to leaving today. I will notify you of your results once received.   You will be contacted regarding your referral to the orthopedist regarding your presumed carpal tunnel syndrome.  Please let us know if you have not been contacted within one week.   I sent refills for sertraline (Zoloft) to the pharmacy.  It was a pleasure to see you today!

## 2018-08-14 NOTE — Assessment & Plan Note (Signed)
Normal in the office today, continue to monitor.  

## 2018-08-14 NOTE — Assessment & Plan Note (Signed)
Doing well on Zoloft 100 mg, denies SI/HI. Refills sent to pharmacy.

## 2018-08-24 ENCOUNTER — Other Ambulatory Visit: Payer: Self-pay | Admitting: Primary Care

## 2018-08-24 DIAGNOSIS — N289 Disorder of kidney and ureter, unspecified: Secondary | ICD-10-CM

## 2018-08-28 ENCOUNTER — Other Ambulatory Visit (INDEPENDENT_AMBULATORY_CARE_PROVIDER_SITE_OTHER): Payer: No Typology Code available for payment source

## 2018-08-28 ENCOUNTER — Other Ambulatory Visit: Payer: Self-pay | Admitting: Primary Care

## 2018-08-28 DIAGNOSIS — N289 Disorder of kidney and ureter, unspecified: Secondary | ICD-10-CM

## 2018-08-28 DIAGNOSIS — R7303 Prediabetes: Secondary | ICD-10-CM

## 2018-08-28 LAB — BASIC METABOLIC PANEL
BUN: 9 mg/dL (ref 6–23)
CALCIUM: 9.2 mg/dL (ref 8.4–10.5)
CO2: 26 meq/L (ref 19–32)
Chloride: 105 mEq/L (ref 96–112)
Creatinine, Ser: 1 mg/dL (ref 0.40–1.20)
GFR: 62.1 mL/min (ref >60.00)
Glucose, Bld: 186 mg/dL — ABNORMAL HIGH (ref 70–99)
Potassium: 3.9 mEq/L (ref 3.5–5.1)
Sodium: 136 mEq/L (ref 135–145)

## 2018-09-05 ENCOUNTER — Telehealth: Payer: Self-pay

## 2018-09-05 NOTE — Telephone Encounter (Signed)
Left message for patient to call back in regards to the referral and sending mychart message also.

## 2018-09-12 ENCOUNTER — Other Ambulatory Visit (HOSPITAL_COMMUNITY)
Admission: RE | Admit: 2018-09-12 | Discharge: 2018-09-12 | Disposition: A | Discharge status: Home / Self Care | Payer: No Typology Code available for payment source | Source: Ambulatory Visit | Attending: Internal Medicine | Admitting: Internal Medicine

## 2018-09-12 ENCOUNTER — Ambulatory Visit (INDEPENDENT_AMBULATORY_CARE_PROVIDER_SITE_OTHER): Payer: No Typology Code available for payment source | Admitting: Primary Care

## 2018-09-12 ENCOUNTER — Encounter: Payer: Self-pay | Admitting: Primary Care

## 2018-09-12 VITALS — BP 120/82 | HR 83 | Temp 98.1°F | Ht 66.0 in | Wt 283.2 lb

## 2018-09-12 DIAGNOSIS — Z124 Encounter for screening for malignant neoplasm of cervix: Secondary | ICD-10-CM | POA: Diagnosis present

## 2018-09-12 DIAGNOSIS — Z23 Encounter for immunization: Secondary | ICD-10-CM | POA: Diagnosis not present

## 2018-09-12 DIAGNOSIS — F329 Major depressive disorder, single episode, unspecified: Secondary | ICD-10-CM

## 2018-09-12 DIAGNOSIS — R7303 Prediabetes: Secondary | ICD-10-CM

## 2018-09-12 DIAGNOSIS — R030 Elevated blood-pressure reading, without diagnosis of hypertension: Secondary | ICD-10-CM

## 2018-09-12 DIAGNOSIS — G5603 Carpal tunnel syndrome, bilateral upper limbs: Secondary | ICD-10-CM | POA: Diagnosis not present

## 2018-09-12 DIAGNOSIS — F32A Depression, unspecified: Secondary | ICD-10-CM

## 2018-09-12 DIAGNOSIS — E282 Polycystic ovarian syndrome: Secondary | ICD-10-CM | POA: Diagnosis not present

## 2018-09-12 DIAGNOSIS — Z0000 Encounter for general adult medical examination without abnormal findings: Secondary | ICD-10-CM | POA: Insufficient documentation

## 2018-09-12 DIAGNOSIS — E1165 Type 2 diabetes mellitus with hyperglycemia: Secondary | ICD-10-CM | POA: Insufficient documentation

## 2018-09-12 DIAGNOSIS — E119 Type 2 diabetes mellitus without complications: Secondary | ICD-10-CM | POA: Insufficient documentation

## 2018-09-12 DIAGNOSIS — F419 Anxiety disorder, unspecified: Secondary | ICD-10-CM

## 2018-09-12 MED ORDER — METFORMIN HCL ER 500 MG PO TB24: 500.0000 mg | ORAL_TABLET | Freq: Every day | ORAL | 3 refills | Status: DC

## 2018-09-12 NOTE — Assessment & Plan Note (Signed)
Stable in the office today, continue to monitor.  

## 2018-09-12 NOTE — Assessment & Plan Note (Signed)
Overall stable. Continue wearing support braces.

## 2018-09-12 NOTE — Addendum Note (Signed)
Addended by: Tawnya Crook on: 09/12/2018 12:37 PM   Modules accepted: Orders

## 2018-09-12 NOTE — Assessment & Plan Note (Signed)
A1C of 6.4 in late January 2020. Given history of infertility coupled with last A1C, will treat with Metformin Er 500 mg daily. Discussed potential side effects. Repeat A1C in 2-3 months.

## 2018-09-12 NOTE — Assessment & Plan Note (Signed)
Tdap and influenza due, provided today.  Pap smear due, completed today. Recommended to work on diet, exercise regularly.  Exam unremarkable. Labs reviewed, repeat in 3 months. Follow up in 1 year for CPE.

## 2018-09-12 NOTE — Assessment & Plan Note (Signed)
History of ectopic pregnancy in 2016, right fallopian tube removed. Long history of PCOS with last menstrual cycle in January 2020, prior to that one year ago. Family history of early menopause in mother. Previously following with GYN, has not seen fertility specialist ever. No recent GYN visit.  A1C of 6.4 in late January 2020. Given this reading and her desire to conceive we will initiate Metformin ER 500 mg daily. Repeat A1C in 3 months.   She will work on getting set up with infertility specialist.

## 2018-09-12 NOTE — Progress Notes (Signed)
Subjective:    Patient ID: Emily Banks, female    DOB: Sep 14, 1980, 38 y.o.   MRN: [id number]  HPI  Emily Banks is a 38 year old female who presents today for complete physical.  Immunizations: -Tetanus: Completed in  -Influenza: Due today  Diet: She endorses a fair diet Breakfast: Skips Lunch: Fast food Dinner: Meat, vegetable, starch Snacks: Occasionally cookies  Desserts: Daily  Beverages: Water, regular soda, coffee, occasional alcohol   Exercise: She is not exercising  Eye exam: Completed 2 years ago Dental exam: Completes semi-annually  Pap Smear: Completed over 3 years ago  BP Readings from Last 3 Encounters:  09/12/18 120/82  08/14/18 122/84  09/03/17 118/80      Review of Systems  Constitutional: Negative for unexpected weight change.  HENT: Negative for rhinorrhea.   Respiratory: Negative for cough and shortness of breath.   Cardiovascular: Negative for chest pain.  Gastrointestinal: Negative for constipation and diarrhea.  Genitourinary: Negative for difficulty urinating.       Irregular menstrual cycles, chronic.   Musculoskeletal: Negative for arthralgias.  Skin: Negative for rash.  Allergic/Immunologic: Negative for environmental allergies.  Neurological: Negative for dizziness and headaches.       Numbness to wrists intermittently from carpal tunnel  Psychiatric/Behavioral: Negative for suicidal ideas. The patient is not nervous/anxious.        Past Medical History:  Diagnosis Date  . Anxiety and depression   . Borderline diabetes   . Ectopic pregnancy 2016  . PCOS (polycystic ovarian syndrome)      Social History   Socioeconomic History  . Marital status: Married    Spouse name: Not on file  . Number of children: Not on file  . Years of education: Not on file  . Highest education level: Not on file  Occupational History  . Not on file  Social Needs  . Financial resource strain: Not on file  . Food insecurity:    Worry:  Not on file    Inability: Not on file  . Transportation needs:    Medical: Not on file    Non-medical: Not on file  Tobacco Use  . Smoking status: Former Games developer  . Smokeless tobacco: Never Used  Substance and Sexual Activity  . Alcohol use: Yes    Alcohol/week: 0.0 standard drinks    Comment: soical  . Drug use: Not on file  . Sexual activity: Not on file  Lifestyle  . Physical activity:    Days per week: Not on file    Minutes per session: Not on file  . Stress: Not on file  Relationships  . Social connections:    Talks on phone: Not on file    Gets together: Not on file    Attends religious service: Not on file    Active member of club or organization: Not on file    Attends meetings of clubs or organizations: Not on file    Relationship status: Not on file  . Intimate partner violence:    Fear of current or ex partner: Not on file    Emotionally abused: Not on file    Physically abused: Not on file    Forced sexual activity: Not on file  Other Topics Concern  . Not on file  Social History Narrative   Married.   No children.   Works at [employer].   Enjoys playing games, spending time with family.    Past Surgical History:  Procedure Laterality Date  .  DILATION AND CURETTAGE OF UTERUS      Family History  Problem Relation Age of Onset  . Diabetes Father   . Hypertension Father   . Heart disease Father   . Melanoma Father   . Hyperlipidemia Mother   . Diabetes Brother   . Hypertension Brother   . Ovarian cancer Paternal Aunt   . Esophageal cancer Maternal Grandmother     No Known Allergies  Current Outpatient Medications on File Prior to Visit  Medication Sig Dispense Refill  . sertraline (ZOLOFT) 100 MG tablet Take 1 tablet (100 mg total) by mouth daily. 90 tablet 3   No current facility-administered medications on file prior to visit.     BP 120/82   Pulse 83   Temp 98.1 F (36.7 C) (Oral)   Ht 5\' 6"  (1.676 m)   Wt 283 lb 4 oz (128.5 kg)    SpO2 97%   BMI 45.72 kg/m    Objective:   Physical Exam  Constitutional: She is oriented to person, place, and time. She appears well-nourished.  HENT:  Mouth/Throat: No oropharyngeal exudate.  Eyes: Pupils are equal, round, and reactive to light. EOM are normal.  Neck: Neck supple. No thyromegaly present.  Cardiovascular: Normal rate and regular rhythm.  Respiratory: Effort normal and breath sounds normal.  GI: Soft. Bowel sounds are normal. There is no abdominal tenderness.  Genitourinary: There is no tenderness or lesion on the right labia. There is no tenderness or lesion on the left labia. Cervix exhibits no discharge. Right adnexum displays no tenderness. Left adnexum displays no tenderness.    No vaginal discharge or erythema.  No erythema in the vagina.  Musculoskeletal: Normal range of motion.  Neurological: She is alert and oriented to person, place, and time.  Skin: Skin is warm and dry.  Psychiatric: She has a normal mood and affect.           Assessment & Plan:

## 2018-09-12 NOTE — Patient Instructions (Signed)
Start exercising. You should be getting 150 minutes of moderate intensity exercise weekly.  It's important to improve your diet by reducing consumption of fast food, fried food, processed snack foods, sugary drinks. Increase consumption of fresh vegetables and fruits, whole grains, water.  Ensure you are drinking 64 ounces of water daily.  Start metformin ER 500 mg tablets for blood sugars. Take 1 tablet every morning with food.  We will be in touch with your pap smear results once received.  Schedule a lab only appointment for 2 months to repeat labs. Make sure to fast four hours prior, you may have water.  It was a pleasure to see you today!   Preventive Care 18-39 Years, Female Preventive care refers to lifestyle choices and visits with your health care provider that can promote health and wellness. What does preventive care include?   A yearly physical exam. This is also called an annual well check.  Dental exams once or twice a year.  Routine eye exams. Ask your health care provider how often you should have your eyes checked.  Personal lifestyle choices, including: ? Daily care of your teeth and gums. ? Regular physical activity. ? Eating a healthy diet. ? Avoiding tobacco and drug use. ? Limiting alcohol use. ? Practicing safe sex. ? Taking vitamin and mineral supplements as recommended by your health care provider. What happens during an annual well check? The services and screenings done by your health care provider during your annual well check will depend on your age, overall health, lifestyle risk factors, and family history of disease. Counseling Your health care provider may ask you questions about your:  Alcohol use.  Tobacco use.  Drug use.  Emotional well-being.  Home and relationship well-being.  Sexual activity.  Eating habits.  Work and work Statistician.  Method of birth control.  Menstrual cycle.  Pregnancy history. Screening You may  have the following tests or measurements:  Height, weight, and BMI.  Diabetes screening. This is done by checking your blood sugar (glucose) after you have not eaten for a while (fasting).  Blood pressure.  Lipid and cholesterol levels. These may be checked every 5 years starting at age 34.  Skin check.  Hepatitis C blood test.  Hepatitis B blood test.  Sexually transmitted disease (STD) testing.  BRCA-related cancer screening. This may be done if you have a family history of breast, ovarian, tubal, or peritoneal cancers.  Pelvic exam and Pap test. This may be done every 3 years starting at age 52. Starting at age 61, this may be done every 5 years if you have a Pap test in combination with an HPV test. Discuss your test results, treatment options, and if necessary, the need for more tests with your health care provider. Vaccines Your health care provider may recommend certain vaccines, such as:  Influenza vaccine. This is recommended every year.  Tetanus, diphtheria, and acellular pertussis (Tdap, Td) vaccine. You may need a Td booster every 10 years.  Varicella vaccine. You may need this if you have not been vaccinated.  HPV vaccine. If you are 32 or younger, you may need three doses over 6 months.  Measles, mumps, and rubella (MMR) vaccine. You may need at least one dose of MMR. You may also need a second dose.  Pneumococcal 13-valent conjugate (PCV13) vaccine. You may need this if you have certain conditions and were not previously vaccinated.  Pneumococcal polysaccharide (PPSV23) vaccine. You may need one or two doses if you smoke  cigarettes or if you have certain conditions.  Meningococcal vaccine. One dose is recommended if you are age 2-21 years and a first-year college student living in a residence hall, or if you have one of several medical conditions. You may also need additional booster doses.  Hepatitis A vaccine. You may need this if you have certain conditions  or if you travel or work in places where you may be exposed to hepatitis A.  Hepatitis B vaccine. You may need this if you have certain conditions or if you travel or work in places where you may be exposed to hepatitis B.  Haemophilus influenzae type b (Hib) vaccine. You may need this if you have certain risk factors. Talk to your health care provider about which screenings and vaccines you need and how often you need them. This information is not intended to replace advice given to you by your health care provider. Make sure you discuss any questions you have with your health care provider. Document Released: 09/04/2001 Document Revised: 02/19/2017 Document Reviewed: 05/10/2015 Elsevier Interactive Patient Education  2019 [employer].

## 2018-09-12 NOTE — Assessment & Plan Note (Signed)
Doing well on Zoloft 100 mg, continue same. Denies SI/HI. 

## 2018-09-16 LAB — CYTOLOGY - PAP
Adequacy: ABSENT
Diagnosis: NEGATIVE
HPV: NOT DETECTED

## 2018-11-10 ENCOUNTER — Telehealth: Payer: Self-pay

## 2018-11-10 NOTE — Telephone Encounter (Signed)
Left detailed VM w COVID screen and curbside info   

## 2018-11-11 ENCOUNTER — Other Ambulatory Visit: Payer: Self-pay

## 2018-12-22 ENCOUNTER — Other Ambulatory Visit: Payer: Self-pay | Admitting: Primary Care

## 2018-12-22 DIAGNOSIS — F32A Depression, unspecified: Secondary | ICD-10-CM

## 2018-12-22 DIAGNOSIS — F329 Major depressive disorder, single episode, unspecified: Secondary | ICD-10-CM

## 2018-12-22 DIAGNOSIS — F419 Anxiety disorder, unspecified: Secondary | ICD-10-CM

## 2018-12-22 MED ORDER — HYDROXYZINE HCL 10 MG PO TABS: 10.0000 mg | ORAL_TABLET | Freq: Two times a day (BID) | ORAL | 0 refills | Status: DC | PRN

## 2018-12-23 NOTE — Telephone Encounter (Signed)
I can send 60 tablets but this medication is taken PRN so a 90 day supply can mean any number of tablets. I don't want her to be stuck with 180 tablets if the medication doesn't help.

## 2018-12-23 NOTE — Telephone Encounter (Signed)
Please advise pt requesting 90 day refill

## 2019-01-27 ENCOUNTER — Other Ambulatory Visit: Payer: Self-pay | Admitting: Primary Care

## 2019-01-27 DIAGNOSIS — F419 Anxiety disorder, unspecified: Secondary | ICD-10-CM

## 2019-01-27 DIAGNOSIS — F329 Major depressive disorder, single episode, unspecified: Secondary | ICD-10-CM

## 2019-03-12 ENCOUNTER — Telehealth: Payer: Self-pay | Admitting: Physician Assistant

## 2019-03-12 DIAGNOSIS — F410 Panic disorder [episodic paroxysmal anxiety] without agoraphobia: Secondary | ICD-10-CM

## 2019-03-12 DIAGNOSIS — R110 Nausea: Secondary | ICD-10-CM

## 2019-03-12 DIAGNOSIS — R5383 Other fatigue: Secondary | ICD-10-CM

## 2019-03-12 DIAGNOSIS — M791 Myalgia, unspecified site: Secondary | ICD-10-CM

## 2019-03-12 NOTE — Progress Notes (Signed)
Based on what you shared with me, I feel your condition warrants further evaluation and I recommend that you be seen for a face to face office visit. I recommend that you contact your primary provider's office tomorrow for a video visit so that they can assess symptoms. This could potentially be reflective of a COVID infection or other illness (infectious versus inflammatory) and needs more assessment than we can give via e-visit.   NOTE: If you entered your credit card information for this eVisit, you will not be charged. You may see a "hold" on your card for the $35 but that hold will drop off and you will not have a charge processed.  If you are having a true medical emergency please call 911.     For an urgent face to face visit, [place] has five urgent care centers for your convenience:   [email or web address] to reserve your spot online an avoid wait times  [place], [place], [place] [address] *Just off [address], across the road from [place] hours of operation: Monday-Friday, 12 PM to 6 PM  Closed Saturday & Sunday    . [facility]    [phone]                  Get Driving Directions  [address], [place] [address] . 10 am to 8 pm Monday-Friday . 12 pm to 8 pm Saturday-Sunday   . [facility] at [place]                  Get Driving Directions  [address], [place], [place] [address] . 8 am to 8 pm Monday-Friday . 9 am to 6 pm Saturday . 11 am to 6 pm Sunday   . [facility] at [place]                  Get Driving Directions   [address] [place], [place] [address] . 8 am to 8 pm Monday-Friday . 8 am to 4 pm Saturday-Sunday    . [facility] at [place]                    Get Driving Directions  [phone]  [address], [place], [place] [address]  . Monday-Friday, 12 PM to 6 PM    Your e-visit answers were reviewed by a board certified advanced clinical practitioner to complete your personal care plan.  Thank you for using e-Visits.

## 2019-03-13 ENCOUNTER — Telehealth: Payer: Self-pay

## 2019-03-13 ENCOUNTER — Ambulatory Visit (HOSPITAL_COMMUNITY)
Admission: EM | Admit: 2019-03-13 | Discharge: 2019-03-13 | Disposition: A | Discharge status: Home / Self Care | Payer: BC Managed Care – PPO | Attending: Urgent Care | Admitting: Urgent Care

## 2019-03-13 ENCOUNTER — Encounter (HOSPITAL_COMMUNITY): Payer: Self-pay | Admitting: Urgent Care

## 2019-03-13 ENCOUNTER — Other Ambulatory Visit: Payer: Self-pay

## 2019-03-13 DIAGNOSIS — B349 Viral infection, unspecified: Secondary | ICD-10-CM | POA: Insufficient documentation

## 2019-03-13 DIAGNOSIS — R110 Nausea: Secondary | ICD-10-CM | POA: Diagnosis not present

## 2019-03-13 DIAGNOSIS — R5383 Other fatigue: Secondary | ICD-10-CM | POA: Diagnosis not present

## 2019-03-13 DIAGNOSIS — R197 Diarrhea, unspecified: Secondary | ICD-10-CM | POA: Diagnosis not present

## 2019-03-13 DIAGNOSIS — M791 Myalgia, unspecified site: Secondary | ICD-10-CM | POA: Diagnosis not present

## 2019-03-13 DIAGNOSIS — Z20828 Contact with and (suspected) exposure to other viral communicable diseases: Secondary | ICD-10-CM | POA: Insufficient documentation

## 2019-03-13 MED ORDER — ONDANSETRON 8 MG PO TBDP: 8.0000 mg | ORAL_TABLET | Freq: Three times a day (TID) | ORAL | 0 refills | Status: DC | PRN

## 2019-03-13 NOTE — ED Triage Notes (Addendum)
Error, wrong patient

## 2019-03-13 NOTE — Discharge Instructions (Signed)

## 2019-03-13 NOTE — ED Triage Notes (Signed)
Patient presents to Urgent Care with complaints of diarrhea 2 days ago and then overwhelming fatigue. Patient reports no diarrhea today, but does have nausea and the fatigue persists. Pt denies vomiting, SOB, or fever.

## 2019-03-13 NOTE — ED Provider Notes (Signed)
MRN: [id number] DOB: 1980/10/11  Subjective:   Emily Banks is a 38 y.o. female presenting for 2-day history of acute onset moderate malaise with associated fatigue, nausea without vomiting, loose stools. Has tried APAP with some relief. No known COVID 19 contacts but has exposure, works in [employer] (medical).  She has also been under a lot of stress from her work.  Has a history of anxiety and depression, takes sertraline and Vistaril for this.  Of note, she does admit that her stools have improved.  However given her exposure at work, would like to get tested for COVID-19.   No current facility-administered medications for this encounter.   Current Outpatient Medications:  .  hydrOXYzine (ATARAX/VISTARIL) 10 MG tablet, Take 1-2 tablets (10-20 mg total) by mouth 2 (two) times daily as needed for anxiety., Disp: 60 tablet, Rfl: 0 .  metFORMIN (GLUCOPHAGE-XR) 500 MG 24 hr tablet, Take 1 tablet (500 mg total) by mouth daily with breakfast., Disp: 90 tablet, Rfl: 3 .  sertraline (ZOLOFT) 100 MG tablet, TAKE 1 TABLET BY MOUTH EVERY DAY, Disp: 90 tablet, Rfl: 1    No Known Allergies   Past Medical History:  Diagnosis Date  . Anxiety and depression   . Borderline diabetes   . Ectopic pregnancy 2016  . PCOS (polycystic ovarian syndrome)      Past Surgical History:  Procedure Laterality Date  . DILATION AND CURETTAGE OF UTERUS      Review of Systems  Constitutional: Positive for malaise/fatigue. Negative for fever.  HENT: Negative for congestion, ear pain, sinus pain and sore throat.   Eyes: Negative for blurred vision, double vision, discharge and redness.  Respiratory: Negative for cough, hemoptysis, shortness of breath and wheezing.   Cardiovascular: Negative for chest pain.  Gastrointestinal: Positive for diarrhea and nausea. Negative for abdominal pain, blood in stool, constipation and vomiting.  Genitourinary: Negative for dysuria, flank pain and hematuria.  Musculoskeletal: Positive  for myalgias.  Skin: Negative for rash.  Neurological: Negative for dizziness, weakness and headaches.  Psychiatric/Behavioral: Negative for depression and substance abuse.    Objective:   Vitals: BP 128/87 (BP Location: Right Arm)   Pulse 67   Temp 98.6 F (37 C) (Oral)   Resp 17   SpO2 100%   Physical Exam Constitutional:      General: She is not in acute distress.    Appearance: Normal appearance. She is well-developed. She is obese. She is not ill-appearing, toxic-appearing or diaphoretic.  HENT:     Head: Normocephalic and atraumatic.     Right Ear: Tympanic membrane and ear canal normal. No drainage or tenderness. No middle ear effusion. Tympanic membrane is not erythematous.     Left Ear: Tympanic membrane and ear canal normal. No drainage or tenderness.  No middle ear effusion. Tympanic membrane is not erythematous.     Nose: Nose normal. No congestion or rhinorrhea.     Mouth/Throat:     Mouth: Mucous membranes are moist. No oral lesions.     Pharynx: Oropharynx is clear. No pharyngeal swelling, oropharyngeal exudate, posterior oropharyngeal erythema or uvula swelling.     Tonsils: No tonsillar exudate or tonsillar abscesses.  Eyes:     Extraocular Movements: Extraocular movements intact.     Right eye: Normal extraocular motion.     Left eye: Normal extraocular motion.     Conjunctiva/sclera: Conjunctivae normal.     Pupils: Pupils are equal, round, and reactive to light.  Neck:     Musculoskeletal: Normal range  of motion and neck supple.  Cardiovascular:     Rate and Rhythm: Normal rate and regular rhythm.     Pulses: Normal pulses.     Heart sounds: Normal heart sounds. No murmur. No friction rub. No gallop.   Pulmonary:     Effort: Pulmonary effort is normal. No respiratory distress.     Breath sounds: Normal breath sounds. No stridor. No wheezing, rhonchi or rales.  Lymphadenopathy:     Cervical: No cervical adenopathy.  Skin:    General: Skin is warm and  dry.     Findings: No rash.  Neurological:     General: No focal deficit present.     Mental Status: She is alert and oriented to person, place, and time.  Psychiatric:        Mood and Affect: Mood normal.        Behavior: Behavior normal.        Thought Content: Thought content normal.        Judgment: Judgment normal.     Assessment and Plan :   1. Viral illness   2. Nausea without vomiting   3. Other fatigue   4. Myalgia   5. Diarrhea, unspecified type     We will manage for viral illness, labs pending. Counseled patient on nature of COVID-19 including modes of transmission, diagnostic testing, management and supportive care.  Offered symptomatic relief. COVID 19 testing is pending. Counseled patient on potential for adverse effects with medications prescribed/recommended today, ER and return-to-clinic precautions discussed, patient verbalized understanding.     Wallis BambergMani, Jessey Huyett, [place] JerseyPA-C 03/13/19 1454

## 2019-03-15 LAB — NOVEL CORONAVIRUS, NAA (HOSP ORDER, SEND-OUT TO REF LAB; TAT 18-24 HRS): SARS-CoV-2, NAA: NOT DETECTED

## 2019-03-16 ENCOUNTER — Encounter (HOSPITAL_COMMUNITY): Payer: Self-pay

## 2019-04-10 ENCOUNTER — Ambulatory Visit
Admission: RE | Admit: 2019-04-10 | Discharge: 2019-04-10 | Disposition: A | Discharge status: Home / Self Care | Payer: BC Managed Care – PPO | Source: Ambulatory Visit | Attending: Family Medicine | Admitting: Family Medicine

## 2019-04-10 ENCOUNTER — Other Ambulatory Visit: Payer: Self-pay | Admitting: Family Medicine

## 2019-04-10 ENCOUNTER — Other Ambulatory Visit: Payer: Self-pay

## 2019-04-10 ENCOUNTER — Encounter: Payer: Self-pay | Admitting: Family Medicine

## 2019-04-10 ENCOUNTER — Ambulatory Visit: Payer: BC Managed Care – PPO | Admitting: Family Medicine

## 2019-04-10 VITALS — BP 118/76 | HR 83 | Temp 97.9°F | Ht 66.0 in | Wt 284.0 lb

## 2019-04-10 DIAGNOSIS — K8050 Calculus of bile duct without cholangitis or cholecystitis without obstruction: Secondary | ICD-10-CM

## 2019-04-10 DIAGNOSIS — R1013 Epigastric pain: Secondary | ICD-10-CM

## 2019-04-10 DIAGNOSIS — K760 Fatty (change of) liver, not elsewhere classified: Secondary | ICD-10-CM | POA: Insufficient documentation

## 2019-04-10 DIAGNOSIS — Z23 Encounter for immunization: Secondary | ICD-10-CM

## 2019-04-10 DIAGNOSIS — K8020 Calculus of gallbladder without cholecystitis without obstruction: Secondary | ICD-10-CM | POA: Diagnosis not present

## 2019-04-10 HISTORY — DX: Calculus of bile duct without cholangitis or cholecystitis without obstruction: K80.50

## 2019-04-10 LAB — COMPREHENSIVE METABOLIC PANEL
ALT: 61 U/L — ABNORMAL HIGH (ref 0–35)
AST: 45 U/L — ABNORMAL HIGH (ref 0–37)
Albumin: 4.6 g/dL (ref 3.5–5.2)
Alkaline Phosphatase: 84 U/L (ref 39–117)
BUN: 11 mg/dL (ref 6–23)
CO2: 28 mEq/L (ref 19–32)
Calcium: 10.3 mg/dL (ref 8.4–10.5)
Chloride: 101 mEq/L (ref 96–112)
Creatinine, Ser: 1.14 mg/dL (ref 0.40–1.20)
GFR: 53.21 mL/min — ABNORMAL LOW (ref >60.00)
Glucose, Bld: 150 mg/dL — ABNORMAL HIGH (ref 70–99)
Potassium: 4.5 mEq/L (ref 3.5–5.1)
Sodium: 136 mEq/L (ref 135–145)
Total Bilirubin: 0.7 mg/dL (ref 0.2–1.2)
Total Protein: 7.3 g/dL (ref 6.0–8.3)

## 2019-04-10 LAB — CBC WITH DIFFERENTIAL/PLATELET
Basophils Absolute: 0.1 10*3/uL (ref 0.0–0.1)
Basophils Relative: 1 % (ref 0.0–3.0)
Eosinophils Absolute: 0.3 10*3/uL (ref 0.0–0.7)
Eosinophils Relative: 2.4 % (ref 0.0–5.0)
HCT: 43.5 % (ref 36.0–46.0)
Hemoglobin: 14.7 g/dL (ref 12.0–15.0)
Lymphocytes Relative: 21.7 % (ref 12.0–46.0)
Lymphs Abs: 2.3 10*3/uL (ref 0.7–4.0)
MCHC: 33.9 g/dL (ref 30.0–36.0)
MCV: 86.8 fl (ref 78.0–100.0)
Monocytes Absolute: 0.7 10*3/uL (ref 0.1–1.0)
Monocytes Relative: 6.2 % (ref 3.0–12.0)
Neutro Abs: 7.3 10*3/uL (ref 1.4–7.7)
Neutrophils Relative %: 68.7 % (ref 43.0–77.0)
Platelets: 315 10*3/uL (ref 150.0–400.0)
RBC: 5.01 Mil/uL (ref 3.87–5.11)
RDW: 13.4 % (ref 11.5–15.5)
WBC: 10.6 10*3/uL — ABNORMAL HIGH (ref 4.0–10.5)

## 2019-04-10 LAB — LIPASE: Lipase: 51 U/L (ref 11.0–59.0)

## 2019-04-10 NOTE — Progress Notes (Signed)
This visit was conducted in person.  BP 118/76 (BP Location: Left Arm, Patient Position: Sitting, Cuff Size: Large)    Pulse 83    Temp 97.9 F (36.6 C) (Temporal)    Ht 5\' 6"  (1.676 m)    Wt 284 lb (128.8 kg)    LMP  (Within Months)    SpO2 96%    BMI 45.84 kg/m    CC: abd pain Subjective:    Patient ID: Roxy HorsemanJennifer Vieau, female    DOB: August 12, 1980, 38 y.o.   MRN: [id number]  HPI: Roxy HorsemanJennifer Furness is a 38 y.o. female presenting on 04/10/2019 for Abdominal Pain (C/o abd pain in epigastric region.  Started 2 days ago.  Had some nausea but has improved.  Denies any fever, vomiting or diarrhea. Concerned it may be gallbladder related due to fmhx. )   2 nights ago had sudden hypersalivation associated with indigestion, severe nausea without vomiting. She started feeling epigastric tightness that was very tender to palpation. Crescendo-decrescendo type pain. She treated nausea with zofran with benefit. Had fried chicken the night before symptoms. Has felt better since, but sill has some epigastric discomfort worse sharp pain with palpation. No boring pain to the back. Some chills that night. Has had more bland diet in last 24 hours which has helped.   Sister with h/o gallstone s/p cholecystectomy.   No fevers/chills, vomiting, diarrhea or constipation. No blood in stool. No dysphagia, early satiety. No GERD type symptoms. No urinary symptoms.  Known h/o PCOS and prediabetes on metformin XR 500mg  daily - has not been taking.      Relevant past medical, surgical, family and social history reviewed and updated as indicated. Interim medical history since our last visit reviewed. Allergies and medications reviewed and updated. Outpatient Medications Prior to Visit  Medication Sig Dispense Refill   hydrOXYzine (ATARAX/VISTARIL) 10 MG tablet Take 1-2 tablets (10-20 mg total) by mouth 2 (two) times daily as needed for anxiety. 60 tablet 0   ondansetron (ZOFRAN-ODT) 8 MG disintegrating tablet  Take 1 tablet (8 mg total) by mouth every 8 (eight) hours as needed for nausea or vomiting. 20 tablet 0   sertraline (ZOLOFT) 100 MG tablet TAKE 1 TABLET BY MOUTH EVERY DAY 90 tablet 1   metFORMIN (GLUCOPHAGE-XR) 500 MG 24 hr tablet Take 1 tablet (500 mg total) by mouth daily with breakfast. (Patient not taking: Reported on 04/10/2019) 90 tablet 3   No facility-administered medications prior to visit.      Per HPI unless specifically indicated in ROS section below Review of Systems Objective:    BP 118/76 (BP Location: Left Arm, Patient Position: Sitting, Cuff Size: Large)    Pulse 83    Temp 97.9 F (36.6 C) (Temporal)    Ht 5\' 6"  (1.676 m)    Wt 284 lb (128.8 kg)    LMP  (Within Months)    SpO2 96%    BMI 45.84 kg/m   Wt Readings from Last 3 Encounters:  04/10/19 284 lb (128.8 kg)  09/12/18 283 lb 4 oz (128.5 kg)  08/14/18 280 lb 12 oz (127.3 kg)    Physical Exam Vitals signs and nursing note reviewed.  Constitutional:      General: She is not in acute distress.    Appearance: Normal appearance. She is obese. She is not ill-appearing.  HENT:     Mouth/Throat:     Mouth: Mucous membranes are moist.     Pharynx: Oropharynx is clear. No oropharyngeal exudate.  Eyes:     Extraocular Movements: Extraocular movements intact.     Pupils: Pupils are equal, round, and reactive to light.  Cardiovascular:     Rate and Rhythm: Normal rate and regular rhythm.     Pulses: Normal pulses.     Heart sounds: Normal heart sounds. No murmur.  Pulmonary:     Effort: Pulmonary effort is normal. No respiratory distress.     Breath sounds: Normal breath sounds. No wheezing, rhonchi or rales.  Abdominal:     General: Abdomen is flat. Bowel sounds are normal. There is no distension.     Palpations: Abdomen is soft. There is no mass.     Tenderness: There is abdominal tenderness (moderate) in the epigastric area. There is no right CVA tenderness, left CVA tenderness, guarding or rebound. Negative  signs include Murphy's sign.     Hernia: No hernia is present.  Skin:    General: Skin is warm.     Findings: No rash.  Neurological:     Mental Status: She is alert.  Psychiatric:        Mood and Affect: Mood normal.        Behavior: Behavior normal.       Results for orders placed or performed during the hospital encounter of 03/13/19  Novel Coronavirus, NAA (hospital order; send-out to ref lab)   Specimen: Nasal Swab; Respiratory  Result Value Ref Range   SARS-CoV-2, NAA NOT DETECTED NOT DETECTED   Coronavirus Source NASOPHARYNGEAL    Assessment & Plan:   Problem List Items Addressed This Visit    Epigastric abdominal pain - Primary    Describes symptoms suspicious for biliary colic. R/o pancreatitis, PUD, gastritis. Reviewed gallstone pathophysiology. Check labs and abd [place] for further evaluation. Discussed possible gen surg referral. for now, avoid fatty/greasy foods. Pt agrees with plan.       Relevant Orders   Comprehensive metabolic panel   CBC with Differential/Platelet   Lipase   US Abdomen Complete    Other Visit Diagnoses    Need for influenza vaccination       Relevant Orders   Flu Vaccine QUAD 36+ mos IM (Completed)       No orders of the defined types were placed in this encounter.  Orders Placed This Encounter  Procedures   US Abdomen Complete    Standing Status:   Future    Standing Expiration Date:   06/09/2020    Order Specific Question:   Reason for Exam (SYMPTOM  OR DIAGNOSIS REQUIRED)    Answer:   abd pain epigastric and RUQ eval gallstones and pancreas    Order Specific Question:   Preferred imaging location?    Answer:   [facility]   Flu Vaccine QUAD 36+ mos IM   Comprehensive metabolic panel   CBC with Differential/Platelet   Lipase    Follow up plan: Return if symptoms worsen or fail to improve.  Ria Bush, MD

## 2019-04-10 NOTE — Assessment & Plan Note (Signed)
Describes symptoms suspicious for biliary colic. R/o pancreatitis, PUD, gastritis. Reviewed gallstone pathophysiology. Check labs and abd [place] for further evaluation. Discussed possible gen surg referral. for now, avoid fatty/greasy foods. Pt agrees with plan.

## 2019-04-10 NOTE — Patient Instructions (Signed)
Flu shot today I am suspicious for gallstones - labs and abdominal ultrasound ordered today. We will be in touch with results. May use ibuprofen 400-600mg  with meals for discomfort as needed.  Avoid greasy fried foods for now.   Cholelithiasis  Cholelithiasis is a form of gallbladder disease in which gallstones form in the gallbladder. The gallbladder is an organ that stores bile. Bile is made in the liver, and it helps to digest fats. Gallstones begin as small crystals and slowly grow into stones. They may cause no symptoms until the gallbladder tightens (contracts) and a gallstone is blocking the duct (gallbladder attack), which can cause pain. Cholelithiasis is also referred to as gallstones. There are two main types of gallstones:  Cholesterol stones. These are made of hardened cholesterol and are usually yellow-green in color. They are the most common type of gallstone. Cholesterol is a white, waxy, fat-like substance that is made in the liver.  Pigment stones. These are dark in color and are made of a red-yellow substance that forms when hemoglobin from red blood cells breaks down (bilirubin). What are the causes? This condition may be caused by an imbalance in the substances that bile is made of. This can happen if the bile:  Has too much bilirubin.  Has too much cholesterol.  Does not have enough bile salts. These salts help the body absorb and digest fats. In some cases, this condition can also be caused by the gallbladder not emptying completely or often enough. What increases the risk? The following factors may make you more likely to develop this condition:  Being female.  Having multiple pregnancies. Health care providers sometimes advise removing diseased gallbladders before future pregnancies.  Eating a diet that is heavy in fried foods, fat, and refined carbohydrates, like white bread and white rice.  Being obese.  Being older than age 77.  Prolonged use of  medicines that contain female hormones (estrogen).  Having diabetes mellitus.  Rapidly losing weight.  Having a family history of gallstones.  Being of [place] or [place] descent.  Having an intestinal disease such as Crohn disease.  Having metabolic syndrome.  Having cirrhosis.  Having severe types of anemia such as sickle cell anemia. What are the signs or symptoms? In most cases, there are no symptoms. These are known as silent gallstones. If a gallstone blocks the bile ducts, it can cause a gallbladder attack. The main symptom of a gallbladder attack is sudden pain in the upper right abdomen. The pain usually comes at night or after eating a large meal. The pain can last for one or several hours and can spread to the right shoulder or chest. If the bile duct is blocked for more than a few hours, it can cause infection or inflammation of the gallbladder, liver, or pancreas, which may cause:  Nausea.  Vomiting.  Abdominal pain that lasts for 5 hours or more.  Fever or chills.  Yellowing of the skin or the whites of the eyes (jaundice).  Dark urine.  Light-colored stools. How is this diagnosed? This condition may be diagnosed based on:  A physical exam.  Your medical history.  An ultrasound of your gallbladder.  CT scan.  MRI.  Blood tests to check for signs of infection or inflammation.  A scan of your gallbladder and bile ducts (biliary system) using nonharmful radioactive material and special cameras that can see the radioactive material (cholescintigram). This test checks to see how your gallbladder contracts and whether bile ducts are  blocked.  Inserting a small tube with a camera on the end (endoscope) through your mouth to inspect bile ducts and check for blockages (endoscopic retrograde cholangiopancreatogram). How is this treated? Treatment for gallstones depends on the severity of the condition. Silent gallstones do not need treatment. If the  gallstones cause a gallbladder attack or other symptoms, treatment may be required. Options for treatment include:  Surgery to remove the gallbladder (cholecystectomy). This is the most common treatment.  Medicines to dissolve gallstones. These are most effective at treating small gallstones. You may need to take medicines for up to 6-12 months.  Shock wave treatment (extracorporeal biliary lithotripsy). In this treatment, an ultrasound machine sends shock waves to the gallbladder to break gallstones into smaller pieces. These pieces can then be passed into the intestines or be dissolved by medicine. This is rarely used.  Removing gallstones through endoscopic retrograde cholangiopancreatogram. A small basket can be attached to the endoscope and used to capture and remove gallstones. Follow these instructions at home:  Take over-the-counter and prescription medicines only as told by your health care provider.  Maintain a healthy weight and follow a healthy diet. This includes: ? Reducing fatty foods, such as fried food. ? Reducing refined carbohydrates, like white bread and white rice. ? Increasing fiber. Aim for foods like almonds, fruit, and beans.  Keep all follow-up visits as told by your health care provider. This is important. Contact a health care provider if:  You think you have had a gallbladder attack.  You have been diagnosed with silent gallstones and you develop abdominal pain or indigestion. Get help right away if:  You have pain from a gallbladder attack that lasts for more than 2 hours.  You have abdominal pain that lasts for more than 5 hours.  You have a fever or chills.  You have persistent nausea and vomiting.  You develop jaundice.  You have dark urine or light-colored stools. Summary  Cholelithiasis (also called gallstones) is a form of gallbladder disease in which gallstones form in the gallbladder.  This condition is caused by an imbalance in the  substances that make up bile. This can happen if the bile has too much cholesterol, too much bilirubin, or not enough bile salts.  You are more likely to develop this condition if you are female, pregnant, using medicines with estrogen, obese, older than age 38, or have a family history of gallstones. You may also develop gallstones if you have diabetes, an intestinal disease, cirrhosis, or metabolic syndrome.  Treatment for gallstones depends on the severity of the condition. Silent gallstones do not need treatment.  If gallstones cause a gallbladder attack or other symptoms, treatment may be needed. The most common treatment is surgery to remove the gallbladder. This information is not intended to replace advice given to you by your health care provider. Make sure you discuss any questions you have with your health care provider. Document Released: 07/05/2005 Document Revised: 06/21/2017 Document Reviewed: 03/25/2016 Elsevier Patient Education  2020 [employer]

## 2019-06-24 DIAGNOSIS — K8010 Calculus of gallbladder with chronic cholecystitis without obstruction: Secondary | ICD-10-CM | POA: Diagnosis not present

## 2019-07-31 ENCOUNTER — Other Ambulatory Visit: Payer: Self-pay | Admitting: Primary Care

## 2019-07-31 DIAGNOSIS — F329 Major depressive disorder, single episode, unspecified: Secondary | ICD-10-CM

## 2019-07-31 DIAGNOSIS — F419 Anxiety disorder, unspecified: Secondary | ICD-10-CM

## 2019-09-15 ENCOUNTER — Other Ambulatory Visit: Payer: Self-pay

## 2019-09-15 DIAGNOSIS — F329 Major depressive disorder, single episode, unspecified: Secondary | ICD-10-CM

## 2019-09-15 DIAGNOSIS — F419 Anxiety disorder, unspecified: Secondary | ICD-10-CM

## 2019-09-15 MED ORDER — SERTRALINE HCL 100 MG PO TABS: 100.0000 mg | ORAL_TABLET | Freq: Every day | ORAL | 0 refills | Status: DC

## 2019-09-26 DIAGNOSIS — H6691 Otitis media, unspecified, right ear: Secondary | ICD-10-CM | POA: Diagnosis not present

## 2019-10-02 ENCOUNTER — Other Ambulatory Visit: Payer: Self-pay | Admitting: Primary Care

## 2019-10-02 DIAGNOSIS — E282 Polycystic ovarian syndrome: Secondary | ICD-10-CM

## 2019-10-02 DIAGNOSIS — R7303 Prediabetes: Secondary | ICD-10-CM

## 2019-10-09 ENCOUNTER — Other Ambulatory Visit (INDEPENDENT_AMBULATORY_CARE_PROVIDER_SITE_OTHER): Payer: BC Managed Care – PPO

## 2019-10-09 ENCOUNTER — Other Ambulatory Visit: Payer: Self-pay

## 2019-10-09 DIAGNOSIS — R7303 Prediabetes: Secondary | ICD-10-CM | POA: Diagnosis not present

## 2019-10-09 DIAGNOSIS — E282 Polycystic ovarian syndrome: Secondary | ICD-10-CM

## 2019-10-09 LAB — CBC
HCT: 40.8 % (ref 36.0–46.0)
Hemoglobin: 13.8 g/dL (ref 12.0–15.0)
MCHC: 33.8 g/dL (ref 30.0–36.0)
MCV: 87.4 fl (ref 78.0–100.0)
Platelets: 305 10*3/uL (ref 150.0–400.0)
RBC: 4.67 Mil/uL (ref 3.87–5.11)
RDW: 13.5 % (ref 11.5–15.5)
WBC: 10 10*3/uL (ref 4.0–10.5)

## 2019-10-09 LAB — COMPREHENSIVE METABOLIC PANEL
ALT: 36 U/L — ABNORMAL HIGH (ref 0–35)
AST: 27 U/L (ref 0–37)
Albumin: 4.2 g/dL (ref 3.5–5.2)
Alkaline Phosphatase: 83 U/L (ref 39–117)
BUN: 11 mg/dL (ref 6–23)
CO2: 23 mEq/L (ref 19–32)
Calcium: 9.1 mg/dL (ref 8.4–10.5)
Chloride: 104 mEq/L (ref 96–112)
Creatinine, Ser: 1.06 mg/dL (ref 0.40–1.20)
GFR: 57.72 mL/min — ABNORMAL LOW (ref >60.00)
Glucose, Bld: 159 mg/dL — ABNORMAL HIGH (ref 70–99)
Potassium: 3.8 mEq/L (ref 3.5–5.1)
Sodium: 134 mEq/L — ABNORMAL LOW (ref 135–145)
Total Bilirubin: 0.4 mg/dL (ref 0.2–1.2)
Total Protein: 7.4 g/dL (ref 6.0–8.3)

## 2019-10-09 LAB — HEMOGLOBIN A1C: Hgb A1c MFr Bld: 7.2 % — ABNORMAL HIGH (ref 4.6–6.5)

## 2019-10-09 LAB — LIPID PANEL
Cholesterol: 181 mg/dL (ref 0–200)
HDL: 41 mg/dL (ref >39.00)
LDL Cholesterol: 119 mg/dL — ABNORMAL HIGH (ref 0–99)
NonHDL: 139.62
Total CHOL/HDL Ratio: 4
Triglycerides: 103 mg/dL (ref 0.0–149.0)
VLDL: 20.6 mg/dL (ref 0.0–40.0)

## 2019-10-16 ENCOUNTER — Encounter: Payer: BC Managed Care – PPO | Admitting: Primary Care

## 2019-10-29 ENCOUNTER — Other Ambulatory Visit: Payer: Self-pay

## 2019-10-29 ENCOUNTER — Encounter: Payer: Self-pay | Admitting: Primary Care

## 2019-10-29 ENCOUNTER — Ambulatory Visit (INDEPENDENT_AMBULATORY_CARE_PROVIDER_SITE_OTHER): Payer: BC Managed Care – PPO | Admitting: Primary Care

## 2019-10-29 VITALS — BP 124/80 | HR 80 | Temp 96.4°F | Ht 66.0 in | Wt 286.0 lb

## 2019-10-29 DIAGNOSIS — Z0000 Encounter for general adult medical examination without abnormal findings: Secondary | ICD-10-CM | POA: Diagnosis not present

## 2019-10-29 DIAGNOSIS — R030 Elevated blood-pressure reading, without diagnosis of hypertension: Secondary | ICD-10-CM

## 2019-10-29 DIAGNOSIS — R7303 Prediabetes: Secondary | ICD-10-CM | POA: Diagnosis not present

## 2019-10-29 DIAGNOSIS — G5603 Carpal tunnel syndrome, bilateral upper limbs: Secondary | ICD-10-CM

## 2019-10-29 DIAGNOSIS — E282 Polycystic ovarian syndrome: Secondary | ICD-10-CM

## 2019-10-29 DIAGNOSIS — F32A Depression, unspecified: Secondary | ICD-10-CM

## 2019-10-29 DIAGNOSIS — F329 Major depressive disorder, single episode, unspecified: Secondary | ICD-10-CM

## 2019-10-29 DIAGNOSIS — K8050 Calculus of bile duct without cholangitis or cholecystitis without obstruction: Secondary | ICD-10-CM

## 2019-10-29 DIAGNOSIS — E119 Type 2 diabetes mellitus without complications: Secondary | ICD-10-CM

## 2019-10-29 DIAGNOSIS — F419 Anxiety disorder, unspecified: Secondary | ICD-10-CM

## 2019-10-29 MED ORDER — METFORMIN HCL ER 500 MG PO TB24: 500.0000 mg | ORAL_TABLET | Freq: Every day | ORAL | 3 refills | Status: DC

## 2019-10-29 MED ORDER — HYDROXYZINE HCL 10 MG PO TABS: 10.0000 mg | ORAL_TABLET | Freq: Two times a day (BID) | ORAL | 0 refills | Status: DC | PRN

## 2019-10-29 MED ORDER — SERTRALINE HCL 100 MG PO TABS
100.0000 mg | ORAL_TABLET | Freq: Every day | ORAL | 3 refills | Status: DC
Start: 2019-10-29 — End: 2020-12-13

## 2019-10-29 NOTE — Assessment & Plan Note (Signed)
New diagnosis with A1C of 7.2. She has not taken Metformin in months.   Resume Metformin XR 500 mg daily. Urine microalbumin next visit. LDL above goal, she is trying to conceive so we will remain off of statin therapy for now. Pneumonia vaccination next visit. She will schedule an eye exam. Foot exam today.  Follow up in 4 months.

## 2019-10-29 NOTE — Assessment & Plan Note (Signed)
Scheduled for surgery last year just prior to Covid, surgery cancelled. She plans on rescheduling this year.

## 2019-10-29 NOTE — Assessment & Plan Note (Signed)
Tetanus UTD. Pap smear UTD. Mammogram due next year. Encouraged a healthy diet, regular exercise. Exam today stable. Labs reviewed.

## 2019-10-29 NOTE — Progress Notes (Signed)
Subjective:    Patient ID: Emily Banks, female    DOB: 03/19/1981, 39 y.o.   MRN: [id number]  HPI  This visit occurred during the SARS-CoV-2 public health emergency.  Safety protocols were in place, including screening questions prior to the visit, additional usage of staff PPE, and extensive cleaning of exam room while observing appropriate contact time as indicated for disinfecting solutions.   Emily Banks is a 39 year old female who presents today for complete physical.  Immunizations: -Tetanus: Completed in 2020 -Influenza: Completed last season  Diet: She endorses a fair diet.  Exercise: She is not exercising  Eye exam: No recent eye exam Dental exam: Completes semi-annually   Pap Smear: Completed in 2020  BP Readings from Last 3 Encounters:  10/29/19 124/80  04/10/19 118/76  03/13/19 128/87     Review of Systems  Constitutional: Negative for unexpected weight change.  HENT: Negative for rhinorrhea.   Respiratory: Negative for cough and shortness of breath.   Cardiovascular: Negative for chest pain.  Gastrointestinal: Negative for constipation and diarrhea.  Genitourinary: Positive for menstrual problem. Negative for difficulty urinating.       No recent menstrual cycle  Musculoskeletal: Positive for arthralgias.       Carpal tunnel to left upper extremity .  Skin: Negative for rash.  Allergic/Immunologic: Negative for environmental allergies.  Neurological: Negative for dizziness and numbness.       Occasional headaches. Numbness to hands and wrists.  Psychiatric/Behavioral:       Doing well on Zoloft.        Past Medical History:  Diagnosis Date  . Anxiety and depression   . Borderline diabetes   . Ectopic pregnancy 2016  . PCOS (polycystic ovarian syndrome)      Social History   Socioeconomic History  . Marital status: Married    Spouse name: Not on file  . Number of children: Not on file  . Years of education: Not on file  . Highest  education level: Not on file  Occupational History  . Not on file  Tobacco Use  . Smoking status: Former Games developer  . Smokeless tobacco: Never Used  Substance and Sexual Activity  . Alcohol use: Yes    Alcohol/week: 0.0 standard drinks    Comment: soical  . Drug use: Not on file  . Sexual activity: Not on file  Other Topics Concern  . Not on file  Social History Narrative   Married.   No children.   Works at [employer].   Enjoys playing games, spending time with family.   Social Determinants of Health   Financial Resource Strain:   . Difficulty of Paying Living Expenses:   Food Insecurity:   . Worried About Programme researcher, broadcasting/film/video in the Last Year:   . Barista in the Last Year:   Transportation Needs:   . Freight forwarder (Medical):   Marland Kitchen Lack of Transportation (Non-Medical):   Physical Activity:   . Days of Exercise per Week:   . Minutes of Exercise per Session:   Stress:   . Feeling of Stress :   Social Connections:   . Frequency of Communication with Friends and Family:   . Frequency of Social Gatherings with Friends and Family:   . Attends Religious Services:   . Active Member of Clubs or Organizations:   . Attends Banker Meetings:   Marland Kitchen Marital Status:   Intimate Partner Violence:   . Fear of  Current or Ex-Partner:   . Emotionally Abused:   Marland Kitchen Physically Abused:   . Sexually Abused:     Past Surgical History:  Procedure Laterality Date  . DILATION AND CURETTAGE OF UTERUS      Family History  Problem Relation Age of Onset  . Diabetes Father   . Hypertension Father   . Heart disease Father   . Melanoma Father   . Hyperlipidemia Mother   . Diabetes Brother   . Hypertension Brother   . Ovarian cancer Paternal Aunt   . Esophageal cancer Maternal Grandmother     No Known Allergies  Current Outpatient Medications on File Prior to Visit  Medication Sig Dispense Refill  . hydrOXYzine (ATARAX/VISTARIL) 10 MG tablet Take 1-2 tablets  (10-20 mg total) by mouth 2 (two) times daily as needed for anxiety. 60 tablet 0  . ondansetron (ZOFRAN-ODT) 8 MG disintegrating tablet Take 1 tablet (8 mg total) by mouth every 8 (eight) hours as needed for nausea or vomiting. 20 tablet 0   No current facility-administered medications on file prior to visit.    BP 124/80   Pulse 80   Temp (!) 96.4 F (35.8 C) (Temporal)   Ht 5\' 6"  (1.676 m)   Wt 286 lb (129.7 kg)   SpO2 98%   BMI 46.16 kg/m    Objective:   Physical Exam  Constitutional: She is oriented to person, place, and time. She appears well-nourished.  HENT:  Right Ear: Tympanic membrane and ear canal normal.  Left Ear: Tympanic membrane and ear canal normal.  Mouth/Throat: Oropharynx is clear and moist.  Eyes: Pupils are equal, round, and reactive to light. EOM are normal.  Cardiovascular: Normal rate and regular rhythm.  Respiratory: Effort normal and breath sounds normal.  GI: Soft. Bowel sounds are normal. There is no abdominal tenderness.  Musculoskeletal:        General: Normal range of motion.     Cervical back: Neck supple.  Neurological: She is alert and oriented to person, place, and time. No cranial nerve deficit.  Reflex Scores:      Patellar reflexes are 2+ on the right side and 2+ on the left side. Skin: Skin is warm and dry.  Psychiatric: She has a normal mood and affect.           Assessment & Plan:

## 2019-10-29 NOTE — Assessment & Plan Note (Signed)
Overall better on Zoloft 100 mg, using hydroxyzine as needed. Continue same.

## 2019-10-29 NOTE — Assessment & Plan Note (Signed)
Evaluated by general surgery who is planning on cholecystectomy.

## 2019-10-29 NOTE — Assessment & Plan Note (Signed)
Stopped taking Metformin months ago. Resumed today given new diagnosis of type 2 diabetes.

## 2019-10-29 NOTE — Patient Instructions (Signed)
Start Metformin XR 500 mg once daily with food for diabetes.  Start exercising. You should be getting 150 minutes of moderate intensity exercise weekly.  It is important that you improve your diet. Please limit carbohydrates in the form of white bread, rice, pasta, sweets, fast food, fried food, sugary drinks, etc. Increase your consumption of fresh fruits and vegetables, whole grains, lean protein.  Ensure you are consuming 64 ounces of water daily.  Please schedule a follow up appointment in 4 months for diabetes check.  It was a pleasure to see you today!   Preventive Care 39-22 Years Old, Female Preventive care refers to visits with your health care provider and lifestyle choices that can promote health and wellness. This includes:  A yearly physical exam. This may also be called an annual well check.  Regular dental visits and eye exams.  Immunizations.  Screening for certain conditions.  Healthy lifestyle choices, such as eating a healthy diet, getting regular exercise, not using drugs or products that contain nicotine and tobacco, and limiting alcohol use. What can I expect for my preventive care visit? Physical exam Your health care provider will check your:  Height and weight. This may be used to calculate body mass index (BMI), which tells if you are at a healthy weight.  Heart rate and blood pressure.  Skin for abnormal spots. Counseling Your health care provider may ask you questions about your:  Alcohol, tobacco, and drug use.  Emotional well-being.  Home and relationship well-being.  Sexual activity.  Eating habits.  Work and work Statistician.  Method of birth control.  Menstrual cycle.  Pregnancy history. What immunizations do I need?  Influenza (flu) vaccine  This is recommended every year. Tetanus, diphtheria, and pertussis (Tdap) vaccine  You may need a Td booster every 10 years. Varicella (chickenpox) vaccine  You may need this if you  have not been vaccinated. Human papillomavirus (HPV) vaccine  If recommended by your health care provider, you may need three doses over 6 months. Measles, mumps, and rubella (MMR) vaccine  You may need at least one dose of MMR. You may also need a second dose. Meningococcal conjugate (MenACWY) vaccine  One dose is recommended if you are age 42-21 years and a first-year college student living in a residence hall, or if you have one of several medical conditions. You may also need additional booster doses. Pneumococcal conjugate (PCV13) vaccine  You may need this if you have certain conditions and were not previously vaccinated. Pneumococcal polysaccharide (PPSV23) vaccine  You may need one or two doses if you smoke cigarettes or if you have certain conditions. Hepatitis A vaccine  You may need this if you have certain conditions or if you travel or work in places where you may be exposed to hepatitis A. Hepatitis B vaccine  You may need this if you have certain conditions or if you travel or work in places where you may be exposed to hepatitis B. Haemophilus influenzae type b (Hib) vaccine  You may need this if you have certain conditions. You may receive vaccines as individual doses or as more than one vaccine together in one shot (combination vaccines). Talk with your health care provider about the risks and benefits of combination vaccines. What tests do I need?  Blood tests  Lipid and cholesterol levels. These may be checked every 5 years starting at age 36.  Hepatitis C test.  Hepatitis B test. Screening  Diabetes screening. This is done by checking your  blood sugar (glucose) after you have not eaten for a while (fasting).  Sexually transmitted disease (STD) testing.  BRCA-related cancer screening. This may be done if you have a family history of breast, ovarian, tubal, or peritoneal cancers.  Pelvic exam and Pap test. This may be done every 3 years starting at age 43.  Starting at age 6, this may be done every 5 years if you have a Pap test in combination with an HPV test. Talk with your health care provider about your test results, treatment options, and if necessary, the need for more tests. Follow these instructions at home: Eating and drinking   Eat a diet that includes fresh fruits and vegetables, whole grains, lean protein, and low-fat dairy.  Take vitamin and mineral supplements as recommended by your health care provider.  Do not drink alcohol if: ? Your health care provider tells you not to drink. ? You are pregnant, may be pregnant, or are planning to become pregnant.  If you drink alcohol: ? Limit how much you have to 0-1 drink a day. ? Be aware of how much alcohol is in your drink. In the [place] one drink equals one 12 oz bottle of beer (355 mL), one 5 oz glass of wine (148 mL), or one 1 oz glass of hard liquor (44 mL). Lifestyle  Take daily care of your teeth and gums.  Stay active. Exercise for at least 30 minutes on 5 or more days each week.  Do not use any products that contain nicotine or tobacco, such as cigarettes, e-cigarettes, and chewing tobacco. If you need help quitting, ask your health care provider.  If you are sexually active, practice safe sex. Use a condom or other form of birth control (contraception) in order to prevent pregnancy and STIs (sexually transmitted infections). If you plan to become pregnant, see your health care provider for a preconception visit. What's next?  Visit your health care provider once a year for a well check visit.  Ask your health care provider how often you should have your eyes and teeth checked.  Stay up to date on all vaccines. This information is not intended to replace advice given to you by your health care provider. Make sure you discuss any questions you have with your health care provider. Document Revised: 03/20/2018 Document Reviewed: 03/20/2018 Elsevier Patient Education   2020 [employer].

## 2019-10-29 NOTE — Assessment & Plan Note (Signed)
Stable in the office today, continue to monitor.  

## 2019-11-06 ENCOUNTER — Other Ambulatory Visit: Payer: Self-pay | Admitting: Primary Care

## 2019-11-06 DIAGNOSIS — F419 Anxiety disorder, unspecified: Secondary | ICD-10-CM

## 2019-11-06 DIAGNOSIS — F329 Major depressive disorder, single episode, unspecified: Secondary | ICD-10-CM

## 2020-01-14 DIAGNOSIS — M654 Radial styloid tenosynovitis [de Quervain]: Secondary | ICD-10-CM | POA: Diagnosis not present

## 2020-02-29 ENCOUNTER — Ambulatory Visit: Payer: BC Managed Care – PPO | Admitting: Primary Care

## 2020-03-07 ENCOUNTER — Ambulatory Visit: Payer: BC Managed Care – PPO | Admitting: Primary Care

## 2020-04-27 DIAGNOSIS — Z1152 Encounter for screening for COVID-19: Secondary | ICD-10-CM | POA: Diagnosis not present

## 2020-04-27 DIAGNOSIS — Z03818 Encounter for observation for suspected exposure to other biological agents ruled out: Secondary | ICD-10-CM | POA: Diagnosis not present

## 2020-05-04 DIAGNOSIS — K8010 Calculus of gallbladder with chronic cholecystitis without obstruction: Secondary | ICD-10-CM | POA: Diagnosis not present

## 2020-06-02 NOTE — Patient Instructions (Addendum)
DUE TO COVID-19 ONLY ONE VISITOR IS ALLOWED TO COME WITH YOU AND STAY IN THE WAITING ROOM ONLY DURING PRE OP AND PROCEDURE DAY OF SURGERY. THE 1 VISITOR  MAY VISIT WITH YOU AFTER SURGERY IN YOUR PRIVATE ROOM DURING VISITING HOURS ONLY!  YOU NEED TO HAVE A COVID 19 TEST ON: 06/07/20 @ 2:45 PM , THIS TEST MUST BE DONE BEFORE SURGERY,  COVID TESTING SITE [address] [place] [address], IT IS ON THE RIGHT GOING OUT [address] APPROXIMATELY  2 MINUTES PAST [employer] ON THE RIGHT. ONCE YOUR COVID TEST IS COMPLETED,  PLEASE BEGIN THE QUARANTINE INSTRUCTIONS AS OUTLINED IN YOUR HANDOUT.                Emily Banks    Your procedure is scheduled on: 06/10/20   Report to [facility] Main  Entrance   Report to admitting at: 7:30 AM     Call this number if you have problems the morning of surgery [phone]    Remember: Do not eat food or drink liquids :After Midnight.   BRUSH YOUR TEETH MORNING OF SURGERY AND RINSE YOUR MOUTH OUT, NO CHEWING GUM CANDY OR MINTS.                               You may not have any metal on your body including hair pins and              piercings  Do not wear jewelry, make-up, lotions, powders or perfumes, deodorant             Do not wear nail polish on your fingernails.  Do not shave  48 hours prior to surgery.        Do not bring valuables to the hospital. [facility] IS NOT             RESPONSIBLE   FOR VALUABLES.  Contacts, dentures or bridgework may not be worn into surgery.  Leave suitcase in the car. After surgery it may be brought to your room.     Patients discharged the day of surgery will not be allowed to drive home. IF YOU ARE HAVING SURGERY AND GOING HOME THE SAME DAY, YOU MUST HAVE AN ADULT TO DRIVE YOU HOME AND BE WITH YOU FOR 24 HOURS. YOU MAY GO HOME BY TAXI OR UBER OR ORTHERWISE, BUT AN ADULT MUST ACCOMPANY YOU HOME AND STAY WITH YOU FOR 24 HOURS.  Name and phone number of your driver:  Special  Instructions: N/A              Please read over the following fact sheets you were given: _____________________________________________________________________         [facility] - Preparing for Surgery Before surgery, you can play an important role.  Because skin is not sterile, your skin needs to be as free of germs as possible.  You can reduce the number of germs on your skin by washing with CHG (chlorahexidine gluconate) soap before surgery.  CHG is an antiseptic cleaner which kills germs and bonds with the skin to continue killing germs even after washing. Please DO NOT use if you have an allergy to CHG or antibacterial soaps.  If your skin becomes reddened/irritated stop using the CHG and inform your nurse when you arrive at Short Stay. Do not shave (including legs and underarms) for at least 48 hours prior to the  first CHG shower.  You may shave your face/neck. Please follow these instructions carefully:  1.  Shower with CHG Soap the night before surgery and the  morning of Surgery.  2.  If you choose to wash your hair, wash your hair first as usual with your  normal  shampoo.  3.  After you shampoo, rinse your hair and body thoroughly to remove the  shampoo.                           4.  Use CHG as you would any other liquid soap.  You can apply chg directly  to the skin and wash                       Gently with a scrungie or clean washcloth.  5.  Apply the CHG Soap to your body ONLY FROM THE NECK DOWN.   Do not use on face/ open                           Wound or open sores. Avoid contact with eyes, ears mouth and genitals (private parts).                       Wash face,  Genitals (private parts) with your normal soap.             6.  Wash thoroughly, paying special attention to the area where your surgery  will be performed.  7.  Thoroughly rinse your body with warm water from the neck down.  8.  DO NOT shower/wash with your normal soap after using and rinsing off  the CHG Soap.                 9.  Pat yourself dry with a clean towel.            10.  Wear clean pajamas.            11.  Place clean sheets on your bed the night of your first shower and do not  sleep with pets. Day of Surgery : Do not apply any lotions/deodorants the morning of surgery.  Please wear clean clothes to the hospital/surgery center.  FAILURE TO FOLLOW THESE INSTRUCTIONS MAY RESULT IN THE CANCELLATION OF YOUR SURGERY PATIENT SIGNATURE_________________________________  NURSE SIGNATURE__________________________________  ________________________________________________________________________

## 2020-06-03 ENCOUNTER — Encounter (HOSPITAL_COMMUNITY): Payer: Self-pay

## 2020-06-03 ENCOUNTER — Encounter (HOSPITAL_COMMUNITY)
Admission: RE | Admit: 2020-06-03 | Discharge: 2020-06-03 | Disposition: A | Discharge status: Home / Self Care | Payer: BC Managed Care – PPO | Source: Ambulatory Visit | Attending: Surgery | Admitting: Surgery

## 2020-06-03 ENCOUNTER — Other Ambulatory Visit: Payer: Self-pay

## 2020-06-03 DIAGNOSIS — Z01812 Encounter for preprocedural laboratory examination: Secondary | ICD-10-CM | POA: Insufficient documentation

## 2020-06-03 HISTORY — DX: Prediabetes: R73.03

## 2020-06-03 HISTORY — DX: Fatty (change of) liver, not elsewhere classified: K76.0

## 2020-06-03 LAB — CBC
HCT: 41.9 % (ref 36.0–46.0)
Hemoglobin: 14 g/dL (ref 12.0–15.0)
MCH: 29.7 pg (ref 26.0–34.0)
MCHC: 33.4 g/dL (ref 30.0–36.0)
MCV: 88.8 fL (ref 80.0–100.0)
Platelets: 282 10*3/uL (ref 150–400)
RBC: 4.72 MIL/uL (ref 3.87–5.11)
RDW: 12.8 % (ref 11.5–15.5)
WBC: 9.2 10*3/uL (ref 4.0–10.5)
nRBC: 0 % (ref 0.0–0.2)

## 2020-06-03 LAB — HEMOGLOBIN A1C
Hgb A1c MFr Bld: 7.4 % — ABNORMAL HIGH (ref 4.8–5.6)
Mean Plasma Glucose: 165.68 mg/dL

## 2020-06-03 NOTE — Progress Notes (Addendum)
COVID Vaccine Completed: yes Date COVID Vaccine completed: 12/03/19 COVID vaccine manufacturer:    Moderna      PCP -  Vernona Rieger: NP. Theron Arista: 10/29/19. Cardiologist -   Chest x-ray -  EKG -  Stress Test -  ECHO -  Cardiac Cath -  Pacemaker/ICD device last checked:  Sleep Study -  CPAP -   Fasting Blood Sugar -  Checks Blood Sugar _____ times a day  Blood Thinner Instructions: Aspirin Instructions: Last Dose:  Anesthesia review:   Patient denies shortness of breath, fever, cough and chest pain at PAT appointment   Patient verbalized understanding of instructions that were given to them at the PAT appointment. Patient was also instructed that they will need to review over the PAT instructions again at home before surgery.

## 2020-06-07 ENCOUNTER — Other Ambulatory Visit (HOSPITAL_COMMUNITY)
Admission: RE | Admit: 2020-06-07 | Discharge: 2020-06-07 | Disposition: A | Discharge status: Home / Self Care | Payer: BC Managed Care – PPO | Source: Ambulatory Visit | Attending: Surgery | Admitting: Surgery

## 2020-06-07 DIAGNOSIS — Z79899 Other long term (current) drug therapy: Secondary | ICD-10-CM | POA: Diagnosis not present

## 2020-06-07 DIAGNOSIS — Z87891 Personal history of nicotine dependence: Secondary | ICD-10-CM | POA: Diagnosis not present

## 2020-06-07 DIAGNOSIS — K8010 Calculus of gallbladder with chronic cholecystitis without obstruction: Secondary | ICD-10-CM | POA: Diagnosis not present

## 2020-06-07 DIAGNOSIS — Z20822 Contact with and (suspected) exposure to covid-19: Secondary | ICD-10-CM | POA: Insufficient documentation

## 2020-06-07 DIAGNOSIS — Z01812 Encounter for preprocedural laboratory examination: Secondary | ICD-10-CM | POA: Insufficient documentation

## 2020-06-07 DIAGNOSIS — Z7984 Long term (current) use of oral hypoglycemic drugs: Secondary | ICD-10-CM | POA: Diagnosis not present

## 2020-06-07 LAB — SARS CORONAVIRUS 2 (TAT 6-24 HRS): SARS Coronavirus 2: NEGATIVE

## 2020-06-08 NOTE — H&P (Signed)
Chief Complaint:  cholecystitis  History of Present Illness:  Emily Banks is an 39 y.o. female with a ~2 cm gallstone.  She was seen in mid October and lap chole was discussed with her.    Past Medical History:  Diagnosis Date  . Anxiety and depression   . Borderline diabetes   . Ectopic pregnancy 2016  . Fatty liver   . PCOS (polycystic ovarian syndrome)   . Pre-diabetes     Past Surgical History:  Procedure Laterality Date  . DILATION AND CURETTAGE OF UTERUS      No current facility-administered medications for this encounter.   Current Outpatient Medications  Medication Sig Dispense Refill  . hydrOXYzine (ATARAX/VISTARIL) 10 MG tablet Take 1-2 tablets (10-20 mg total) by mouth 2 (two) times daily as needed for anxiety. 60 tablet 0  . sertraline (ZOLOFT) 100 MG tablet Take 1 tablet (100 mg total) by mouth daily. For anxiety and depression. (Patient taking differently: Take 100 mg by mouth daily. Marland Kitchen) 90 tablet 3  . metFORMIN (GLUCOPHAGE-XR) 500 MG 24 hr tablet Take 1 tablet (500 mg total) by mouth daily with breakfast. (Patient not taking: Reported on 05/31/2020) 90 tablet 3  . ondansetron (ZOFRAN-ODT) 8 MG disintegrating tablet Take 1 tablet (8 mg total) by mouth every 8 (eight) hours as needed for nausea or vomiting. (Patient not taking: Reported on 05/31/2020) 20 tablet 0   Patient has no known allergies. Family History  Problem Relation Age of Onset  . Diabetes Father   . Hypertension Father   . Heart disease Father   . Melanoma Father   . Hyperlipidemia Mother   . Diabetes Brother   . Hypertension Brother   . Ovarian cancer Paternal Aunt   . Esophageal cancer Maternal Grandmother    Social History:   reports that she has quit smoking. She has never used smokeless tobacco. She reports current alcohol use. She reports that she does not use drugs.   REVIEW OF SYSTEMS : Negative except for see the problem list  Physical Exam:   There were no vitals taken for this  visit. There is no height or weight on file to calculate BMI.  Gen:  WDWN white female NAD  Neurological: Alert and oriented to person, place, and time. Motor and sensory function is grossly intact  Head: Normocephalic and atraumatic.  Eyes: Conjunctivae are normal. Pupils are equal, round, and reactive to light. No scleral icterus.  Neck: Normal range of motion. Neck supple. No tracheal deviation or thyromegaly present.  Cardiovascular:  SR without murmurs or gallops.  No carotid bruits Breast:  Not examined Respiratory: Effort normal.  No respiratory distress. No chest wall tenderness. Breath sounds normal.  No wheezes, rales or rhonchi.  Abdomen:  nontender GU:  Not examined Musculoskeletal: Normal range of motion. Extremities are nontender. No cyanosis, edema or clubbing noted Lymphadenopathy: No cervical, preauricular, postauricular or axillary adenopathy is present Skin: Skin is warm and dry. No rash noted. No diaphoresis. No erythema. No pallor. Pscyh: Normal mood and affect. Behavior is normal. Judgment and thought content normal.   LABORATORY RESULTS: Results for orders placed or performed during the hospital encounter of 06/07/20 (from the past 48 hour(s))  SARS CORONAVIRUS 2 (TAT 6-24 HRS) Nasopharyngeal Nasopharyngeal Swab     Status: None   Collection Time: 06/07/20  1:12 PM   Specimen: Nasopharyngeal Swab  Result Value Ref Range   SARS Coronavirus 2 NEGATIVE NEGATIVE    Comment: (NOTE) SARS-CoV-2 target nucleic acids are  NOT DETECTED.  The SARS-CoV-2 RNA is generally detectable in upper and lower respiratory specimens during the acute phase of infection. Negative results do not preclude SARS-CoV-2 infection, do not rule out co-infections with other pathogens, and should not be used as the sole basis for treatment or other patient management decisions. Negative results must be combined with clinical observations, patient history, and epidemiological information. The  expected result is Negative.  Fact Sheet for Patients: HairSlick.no  Fact Sheet for Healthcare Providers: quierodirigir.com  This test is not yet approved or cleared by the [place] FDA and  has been authorized for detection and/or diagnosis of SARS-CoV-2 by FDA under an Emergency Use Authorization (EUA). This EUA will remain  in effect (meaning this test can be used) for the duration of the COVID-19 declaration under Se ction 564(b)(1) of the Act, 21 U.S.C. section 360bbb-3(b)(1), unless the authorization is terminated or revoked sooner.  Performed at [facility], [address], [place], [place] [address]      RADIOLOGY RESULTS: No results found.  Problem List: Patient Active Problem List   Diagnosis Date Noted  . Biliary colic 04/10/2019  . Fatty liver 04/10/2019  . PCOS (polycystic ovarian syndrome) 09/12/2018  . Preventative health care 09/12/2018  . Type 2 diabetes mellitus (HCC) 09/12/2018  . Carpal tunnel syndrome 04/09/2017  . Anxiety and depression 01/31/2016  . Elevated blood pressure reading 01/31/2016    Assessment & Plan: 39 year old white female who has been seen in the office with  Related to a 2.4 cm gallstone.  Informed consent has been obtained and she also has been given a Glass blower/designer booklet. For lap  cholecystectomy    Matt B. Daphine Deutscher, MD, [facility] [phone] beeper [phone]  06/08/2020 7:32 AM

## 2020-06-09 IMAGING — US US ABDOMEN COMPLETE
1 series · 13 of 25 positions shown · non-contrast
Comparison: None.

CLINICAL DATA: Abdominal pain

EXAM:
ABDOMEN ULTRASOUND COMPLETE

[Series 1: us abdomen complete · 0.26mm/px · 13 of 80 slices shown]
[im 1/80]
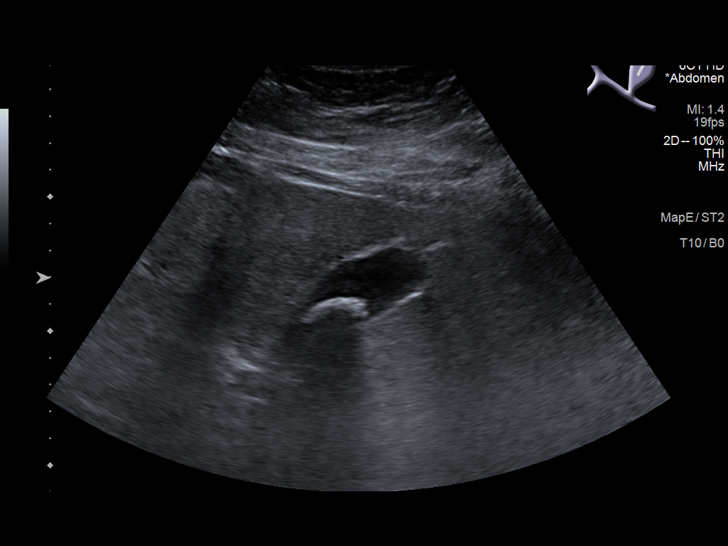
[im 7/80]
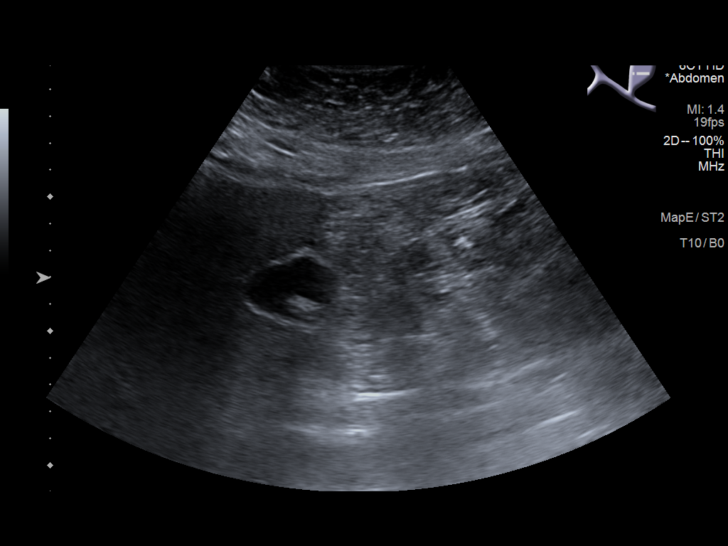
[im 14/80]
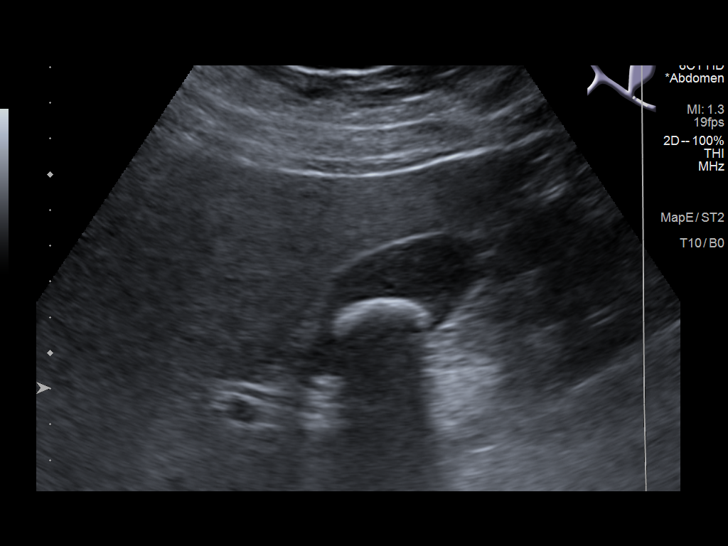
[im 20/80]
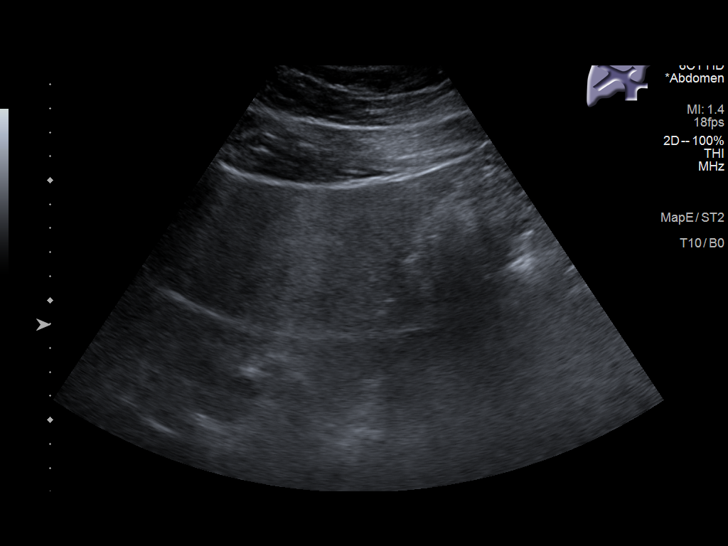
[im 27/80]
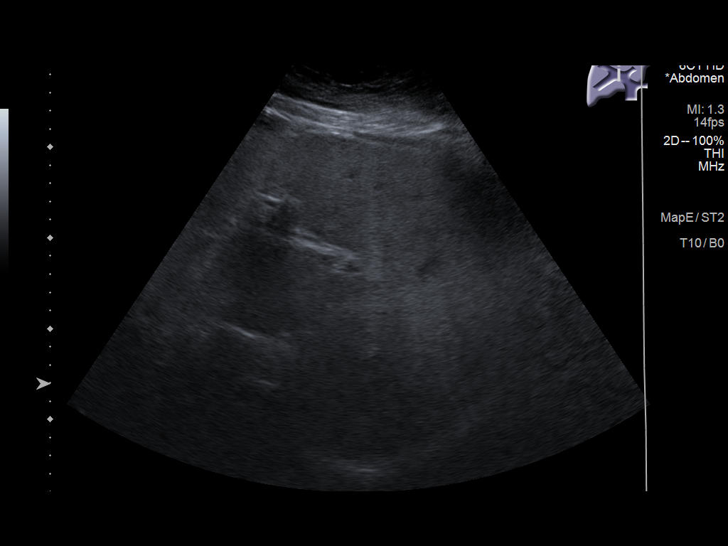
[im 33/80]
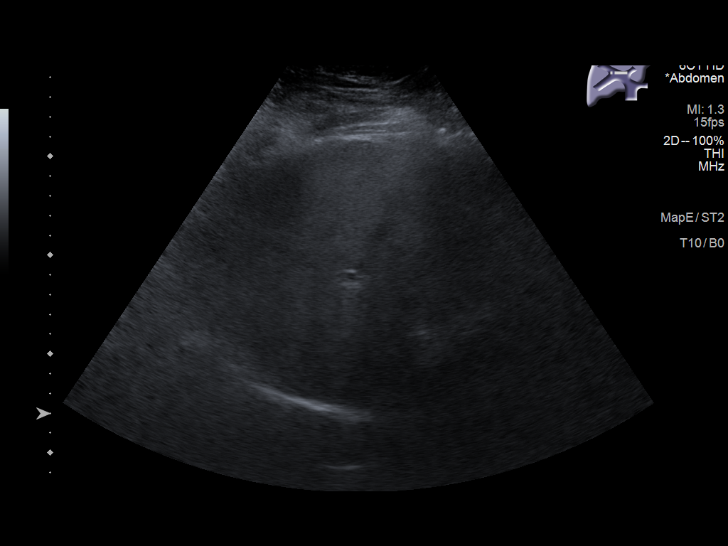
[im 40/80]
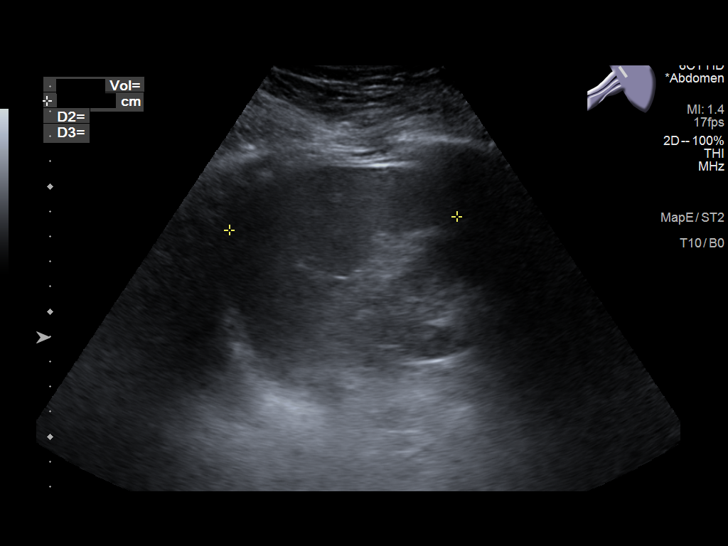
[im 47/80]
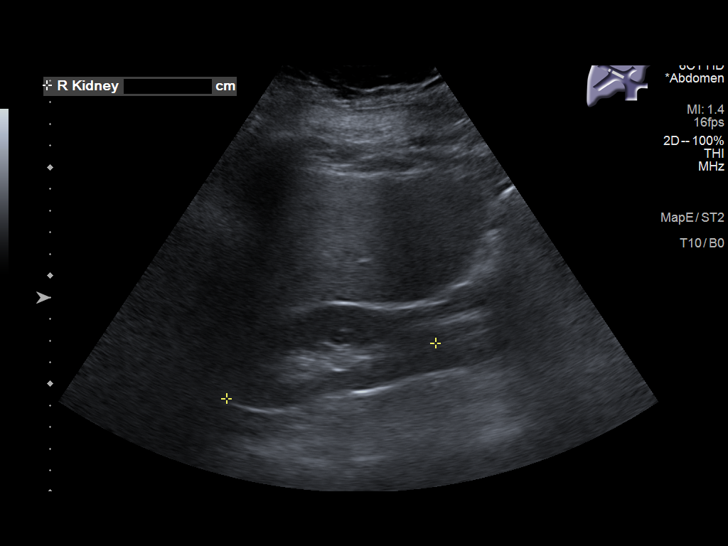
[im 53/80]
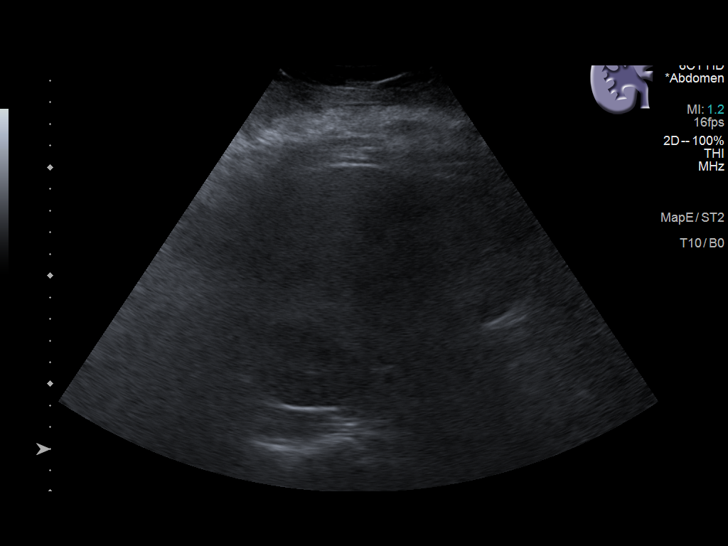
[im 60/80]
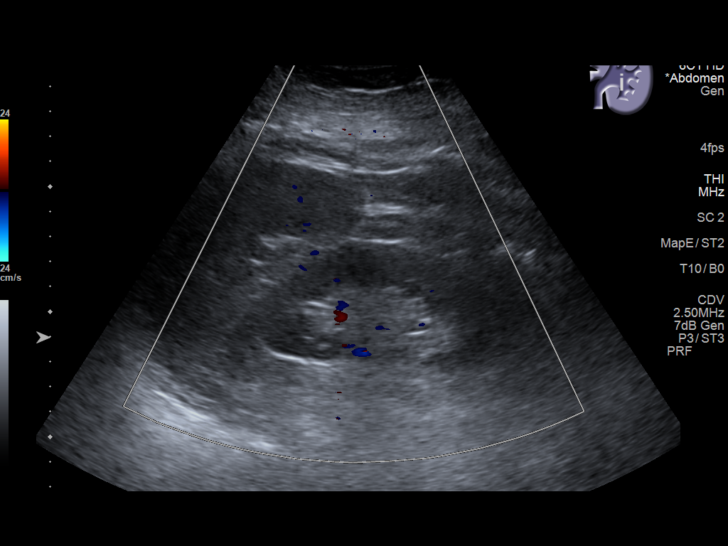
[im 66/80]
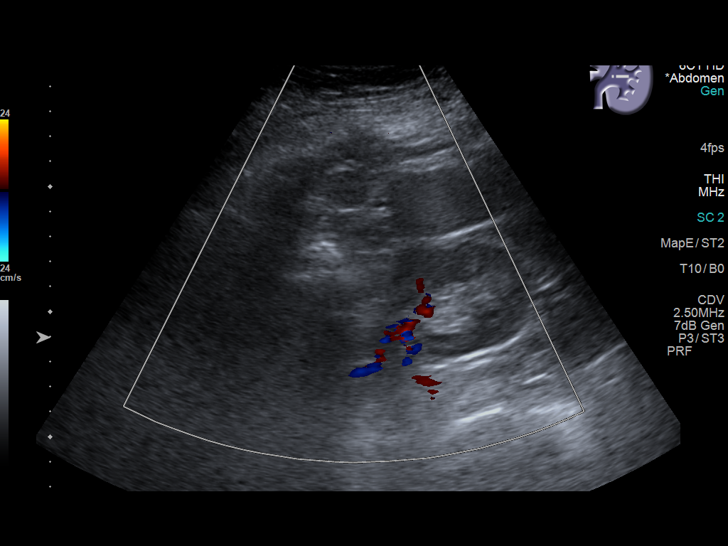
[im 73/80]
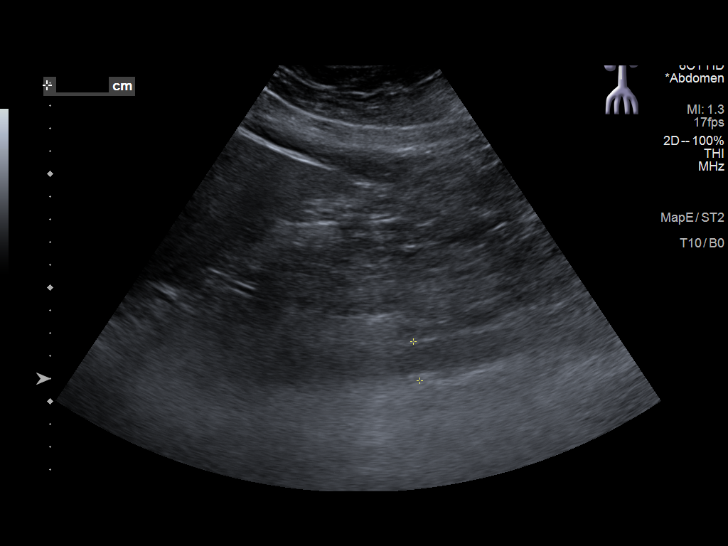
[im 80/80]
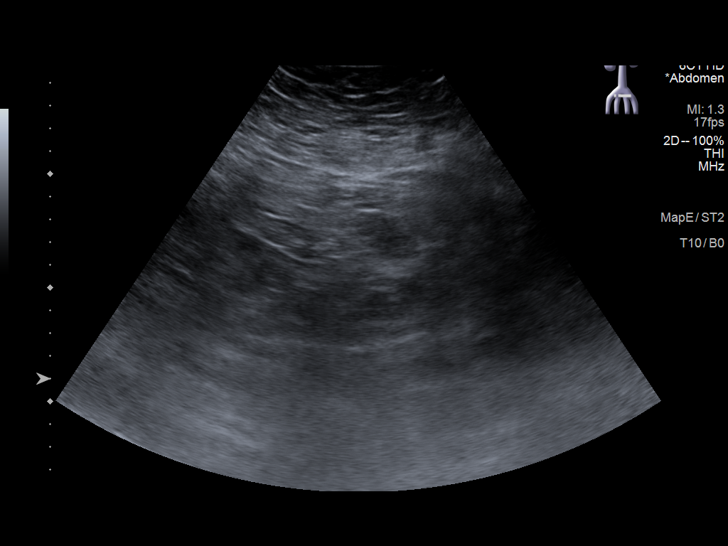

[13 of 25 positions shown; findings below may reference images not displayed]

FINDINGS: Gallbladder: Within the gallbladder, there are echogenic foci which
move and shadow consistent with cholelithiasis. Largest gallstone
measures 2.4 cm in length. There is no gallbladder wall thickening
or pericholecystic fluid. No sonographic Murphy sign noted by
sonographer.

Common bile duct: Diameter: 3 mm. No intrahepatic, common hepatic,
or common bile duct dilatation.

Liver: No focal lesion identified. Liver echogenicity is increased
diffusely. Portal vein is patent on color Doppler imaging with
normal direction of blood flow towards the liver.

IVC: No abnormality visualized.

Pancreas: Visualized portion unremarkable. Portions of pancreas are
obscured by gas.

Spleen: Size and appearance within normal limits.

Right Kidney: Length: 10.0 cm. Echogenicity within normal limits. No
mass or hydronephrosis visualized.

Left Kidney: Length: 10.2 cm. Echogenicity within normal limits. No
mass or hydronephrosis visualized.

Abdominal aorta: No aneurysm visualized.

Other findings: No demonstrable ascites.
IMPRESSION: 1. Cholelithiasis. No gallbladder wall thickening or pericholecystic
fluid.

2. Diffuse increased liver echogenicity, a finding indicative of
hepatic steatosis. While no focal liver lesions are evident on this
study, it must be cautioned that the sensitivity of ultrasound for
detection of focal liver lesions is diminished in this circumstance.

3. Portions of pancreas obscured by gas. Visualized portions of
pancreas appear unremarkable.

4.  Study otherwise unremarkable.

## 2020-06-09 MED ORDER — BUPIVACAINE LIPOSOME 1.3 % IJ SUSP
20.0000 mL | Freq: Once | INTRAMUSCULAR | Status: DC
  Filled 2020-06-09: qty 20

## 2020-06-09 MED ORDER — DEXTROSE 5 % IV SOLN
3.0000 g | INTRAVENOUS | Status: AC
  Administered 2020-06-10: 3 g via INTRAVENOUS
  Filled 2020-06-09: qty 3

## 2020-06-10 ENCOUNTER — Encounter (HOSPITAL_COMMUNITY): Payer: Self-pay | Admitting: Surgery

## 2020-06-10 ENCOUNTER — Encounter (HOSPITAL_COMMUNITY)
Admission: RE | Disposition: A | Discharge status: Home / Self Care | Payer: Self-pay | Source: Other Acute Inpatient Hospital | Attending: Surgery

## 2020-06-10 ENCOUNTER — Ambulatory Visit (HOSPITAL_COMMUNITY)
Admission: RE | Admit: 2020-06-10 | Discharge: 2020-06-10 | Disposition: A | Discharge status: Home / Self Care | Payer: BC Managed Care – PPO | Source: Other Acute Inpatient Hospital | Attending: Surgery | Admitting: Surgery

## 2020-06-10 ENCOUNTER — Ambulatory Visit (HOSPITAL_COMMUNITY): Payer: BC Managed Care – PPO | Admitting: Certified Registered Nurse Anesthetist

## 2020-06-10 ENCOUNTER — Ambulatory Visit (HOSPITAL_COMMUNITY): Payer: BC Managed Care – PPO

## 2020-06-10 DIAGNOSIS — Z419 Encounter for procedure for purposes other than remedying health state, unspecified: Secondary | ICD-10-CM

## 2020-06-10 DIAGNOSIS — Z79899 Other long term (current) drug therapy: Secondary | ICD-10-CM | POA: Diagnosis not present

## 2020-06-10 DIAGNOSIS — Z7984 Long term (current) use of oral hypoglycemic drugs: Secondary | ICD-10-CM | POA: Insufficient documentation

## 2020-06-10 DIAGNOSIS — F418 Other specified anxiety disorders: Secondary | ICD-10-CM | POA: Diagnosis not present

## 2020-06-10 DIAGNOSIS — E119 Type 2 diabetes mellitus without complications: Secondary | ICD-10-CM | POA: Diagnosis not present

## 2020-06-10 DIAGNOSIS — Z20822 Contact with and (suspected) exposure to covid-19: Secondary | ICD-10-CM | POA: Insufficient documentation

## 2020-06-10 DIAGNOSIS — K8064 Calculus of gallbladder and bile duct with chronic cholecystitis without obstruction: Secondary | ICD-10-CM | POA: Diagnosis not present

## 2020-06-10 DIAGNOSIS — K8010 Calculus of gallbladder with chronic cholecystitis without obstruction: Secondary | ICD-10-CM | POA: Diagnosis not present

## 2020-06-10 DIAGNOSIS — Z87891 Personal history of nicotine dependence: Secondary | ICD-10-CM | POA: Diagnosis not present

## 2020-06-10 HISTORY — PX: CHOLECYSTECTOMY: SHX55

## 2020-06-10 LAB — HCG, SERUM, QUALITATIVE: Preg, Serum: NEGATIVE

## 2020-06-10 SURGERY — LAPAROSCOPIC CHOLECYSTECTOMY WITH INTRAOPERATIVE CHOLANGIOGRAM
Anesthesia: General | Site: Abdomen

## 2020-06-10 MED ORDER — ORAL CARE MOUTH RINSE: 15.0000 mL | Freq: Once | OROMUCOSAL | Status: AC

## 2020-06-10 MED ORDER — LACTATED RINGERS IV SOLN: INTRAVENOUS | Status: DC

## 2020-06-10 MED ORDER — ONDANSETRON HCL 4 MG/2ML IJ SOLN
INTRAMUSCULAR | Status: AC
  Filled 2020-06-10: qty 2

## 2020-06-10 MED ORDER — MIDAZOLAM HCL 5 MG/5ML IJ SOLN
INTRAMUSCULAR | Status: DC | PRN
  Administered 2020-06-10: 2 mg via INTRAVENOUS

## 2020-06-10 MED ORDER — ESMOLOL HCL 100 MG/10ML IV SOLN
INTRAVENOUS | Status: AC
  Filled 2020-06-10: qty 10

## 2020-06-10 MED ORDER — KETOROLAC TROMETHAMINE 30 MG/ML IJ SOLN
INTRAMUSCULAR | Status: AC
  Filled 2020-06-10: qty 1

## 2020-06-10 MED ORDER — PROMETHAZINE HCL 25 MG/ML IJ SOLN: 6.2500 mg | INTRAMUSCULAR | Status: DC | PRN

## 2020-06-10 MED ORDER — PHENYLEPHRINE 40 MCG/ML (10ML) SYRINGE FOR IV PUSH (FOR BLOOD PRESSURE SUPPORT)
PREFILLED_SYRINGE | INTRAVENOUS | Status: DC | PRN
  Administered 2020-06-10: 80 ug via INTRAVENOUS

## 2020-06-10 MED ORDER — ACETAMINOPHEN 500 MG PO TABS
1000.0000 mg | ORAL_TABLET | ORAL | Status: AC
  Administered 2020-06-10: 1000 mg via ORAL
  Filled 2020-06-10: qty 2

## 2020-06-10 MED ORDER — FENTANYL CITRATE (PF) 100 MCG/2ML IJ SOLN
INTRAMUSCULAR | Status: DC | PRN
  Administered 2020-06-10 (×4): 50 ug via INTRAVENOUS
  Administered 2020-06-10: 150 ug via INTRAVENOUS

## 2020-06-10 MED ORDER — DEXAMETHASONE SODIUM PHOSPHATE 4 MG/ML IJ SOLN
INTRAMUSCULAR | Status: DC | PRN
  Administered 2020-06-10: 10 mg via INTRAVENOUS

## 2020-06-10 MED ORDER — SUGAMMADEX SODIUM 200 MG/2ML IV SOLN
INTRAVENOUS | Status: DC | PRN
  Administered 2020-06-10: 200 mg via INTRAVENOUS

## 2020-06-10 MED ORDER — SCOPOLAMINE 1 MG/3DAYS TD PT72
1.0000 | MEDICATED_PATCH | TRANSDERMAL | Status: DC
  Administered 2020-06-10: 1.5 mg via TRANSDERMAL
  Filled 2020-06-10: qty 1

## 2020-06-10 MED ORDER — ONDANSETRON HCL 4 MG/2ML IJ SOLN
INTRAMUSCULAR | Status: DC | PRN
  Administered 2020-06-10: 4 mg via INTRAVENOUS

## 2020-06-10 MED ORDER — LACTATED RINGERS IR SOLN
Status: DC | PRN
  Administered 2020-06-10: 1000 mL

## 2020-06-10 MED ORDER — MEPERIDINE HCL 50 MG/ML IJ SOLN: 6.2500 mg | INTRAMUSCULAR | Status: DC | PRN

## 2020-06-10 MED ORDER — SUCCINYLCHOLINE CHLORIDE 200 MG/10ML IV SOSY
PREFILLED_SYRINGE | INTRAVENOUS | Status: DC | PRN
  Administered 2020-06-10: 140 mg via INTRAVENOUS

## 2020-06-10 MED ORDER — LIDOCAINE 2% (20 MG/ML) 5 ML SYRINGE
INTRAMUSCULAR | Status: DC | PRN
  Administered 2020-06-10: 80 mg via INTRAVENOUS

## 2020-06-10 MED ORDER — MIDAZOLAM HCL 2 MG/2ML IJ SOLN
INTRAMUSCULAR | Status: AC
  Filled 2020-06-10: qty 2

## 2020-06-10 MED ORDER — SUGAMMADEX SODIUM 500 MG/5ML IV SOLN
INTRAVENOUS | Status: AC
  Filled 2020-06-10: qty 5

## 2020-06-10 MED ORDER — KETOROLAC TROMETHAMINE 30 MG/ML IJ SOLN
30.0000 mg | Freq: Once | INTRAMUSCULAR | Status: AC | PRN
  Administered 2020-06-10: 30 mg via INTRAVENOUS

## 2020-06-10 MED ORDER — FENTANYL CITRATE (PF) 100 MCG/2ML IJ SOLN
INTRAMUSCULAR | Status: AC
  Filled 2020-06-10: qty 2

## 2020-06-10 MED ORDER — CHLORHEXIDINE GLUCONATE CLOTH 2 % EX PADS: 6.0000 | MEDICATED_PAD | Freq: Once | CUTANEOUS | Status: DC

## 2020-06-10 MED ORDER — HYDROMORPHONE HCL 1 MG/ML IJ SOLN: 0.2500 mg | INTRAMUSCULAR | Status: DC | PRN

## 2020-06-10 MED ORDER — ROCURONIUM BROMIDE 10 MG/ML (PF) SYRINGE
PREFILLED_SYRINGE | INTRAVENOUS | Status: DC | PRN
  Administered 2020-06-10: 20 mg via INTRAVENOUS
  Administered 2020-06-10: 50 mg via INTRAVENOUS

## 2020-06-10 MED ORDER — PROPOFOL 10 MG/ML IV BOLUS
INTRAVENOUS | Status: DC | PRN
  Administered 2020-06-10: 200 mg via INTRAVENOUS

## 2020-06-10 MED ORDER — FENTANYL CITRATE (PF) 250 MCG/5ML IJ SOLN
INTRAMUSCULAR | Status: AC
  Filled 2020-06-10: qty 5

## 2020-06-10 MED ORDER — LABETALOL HCL 5 MG/ML IV SOLN
INTRAVENOUS | Status: AC
  Filled 2020-06-10: qty 4

## 2020-06-10 MED ORDER — PROPOFOL 10 MG/ML IV BOLUS
INTRAVENOUS | Status: AC
  Filled 2020-06-10: qty 20

## 2020-06-10 MED ORDER — BUPIVACAINE LIPOSOME 1.3 % IJ SUSP
INTRAMUSCULAR | Status: DC | PRN
  Administered 2020-06-10: 20 mL

## 2020-06-10 MED ORDER — SUCCINYLCHOLINE CHLORIDE 200 MG/10ML IV SOSY
PREFILLED_SYRINGE | INTRAVENOUS | Status: AC
  Filled 2020-06-10: qty 10

## 2020-06-10 MED ORDER — CHLORHEXIDINE GLUCONATE 0.12 % MT SOLN
15.0000 mL | Freq: Once | OROMUCOSAL | Status: AC
  Administered 2020-06-10: 15 mL via OROMUCOSAL

## 2020-06-10 MED ORDER — 0.9 % SODIUM CHLORIDE (POUR BTL) OPTIME
TOPICAL | Status: DC | PRN
  Administered 2020-06-10: 1000 mL

## 2020-06-10 MED ORDER — ROCURONIUM BROMIDE 10 MG/ML (PF) SYRINGE
PREFILLED_SYRINGE | INTRAVENOUS | Status: AC
  Filled 2020-06-10: qty 10

## 2020-06-10 MED ORDER — HYDROMORPHONE HCL 2 MG/ML IJ SOLN
INTRAMUSCULAR | Status: AC
  Filled 2020-06-10: qty 1

## 2020-06-10 MED ORDER — LIDOCAINE 2% (20 MG/ML) 5 ML SYRINGE
INTRAMUSCULAR | Status: AC
  Filled 2020-06-10: qty 5

## 2020-06-10 MED ORDER — HYDROCODONE-ACETAMINOPHEN 5-325 MG PO TABS: 1.0000 | ORAL_TABLET | Freq: Four times a day (QID) | ORAL | 0 refills | Status: DC | PRN

## 2020-06-10 MED ORDER — DEXAMETHASONE SODIUM PHOSPHATE 10 MG/ML IJ SOLN
INTRAMUSCULAR | Status: AC
  Filled 2020-06-10: qty 1

## 2020-06-10 MED ORDER — PHENYLEPHRINE 40 MCG/ML (10ML) SYRINGE FOR IV PUSH (FOR BLOOD PRESSURE SUPPORT)
PREFILLED_SYRINGE | INTRAVENOUS | Status: AC
  Filled 2020-06-10: qty 10

## 2020-06-10 SURGICAL SUPPLY — 51 items
APPLICATOR COTTON TIP 6 STRL (MISCELLANEOUS) ×2 IMPLANT
APPLICATOR COTTON TIP 6IN STRL (MISCELLANEOUS) ×6
APPLIER CLIP 5 13 M/L LIGAMAX5 (MISCELLANEOUS)
APPLIER CLIP ROT 10 11.4 M/L (STAPLE) ×3
BENZOIN TINCTURE PRP APPL 2/3 (GAUZE/BANDAGES/DRESSINGS) IMPLANT
CATH REDDICK CHOLANGI 4FR 50CM (CATHETERS) ×3 IMPLANT
CLIP APPLIE 5 13 M/L LIGAMAX5 (MISCELLANEOUS) IMPLANT
CLIP APPLIE ROT 10 11.4 M/L (STAPLE) ×1 IMPLANT
CLOSURE WOUND 1/2 X4 (GAUZE/BANDAGES/DRESSINGS)
COVER MAYO STAND STRL (DRAPES) ×3 IMPLANT
COVER SURGICAL LIGHT HANDLE (MISCELLANEOUS) ×3 IMPLANT
COVER WAND RF STERILE (DRAPES) IMPLANT
DECANTER SPIKE VIAL GLASS SM (MISCELLANEOUS) IMPLANT
DERMABOND ADVANCED (GAUZE/BANDAGES/DRESSINGS) ×2
DERMABOND ADVANCED .7 DNX12 (GAUZE/BANDAGES/DRESSINGS) ×1 IMPLANT
DRAPE C-ARM 42X120 X-RAY (DRAPES) ×3 IMPLANT
ELECT L-HOOK LAP 45CM DISP (ELECTROSURGICAL) ×3
ELECT PENCIL ROCKER SW 15FT (MISCELLANEOUS) IMPLANT
ELECT REM PT RETURN 15FT ADLT (MISCELLANEOUS) ×3 IMPLANT
ELECTRODE L-HOOK LAP 45CM DISP (ELECTROSURGICAL) ×1 IMPLANT
GLOVE BIO SURGEON STRL SZ 6 (GLOVE) ×3 IMPLANT
GLOVE BIO SURGEON STRL SZ 6.5 (GLOVE) ×2 IMPLANT
GLOVE BIO SURGEON STRL SZ7.5 (GLOVE) ×3 IMPLANT
GLOVE BIO SURGEONS STRL SZ 6.5 (GLOVE) ×1
GLOVE BIOGEL M 8.0 STRL (GLOVE) ×3 IMPLANT
GLOVE BIOGEL PI IND STRL 6.5 (GLOVE) ×3 IMPLANT
GLOVE BIOGEL PI INDICATOR 6.5 (GLOVE) ×6
GLOVE ECLIPSE 7.0 STRL STRAW (GLOVE) ×3 IMPLANT
GLOVE INDICATOR 8.0 STRL GRN (GLOVE) ×3 IMPLANT
GOWN STRL REUS W/TWL LRG LVL3 (GOWN DISPOSABLE) ×6 IMPLANT
GOWN STRL REUS W/TWL XL LVL3 (GOWN DISPOSABLE) ×9 IMPLANT
GRASPER SUT TROCAR 14GX15 (MISCELLANEOUS) ×3 IMPLANT
HEMOSTAT SURGICEL 4X8 (HEMOSTASIS) IMPLANT
IV CATH 14GX2 1/4 (CATHETERS) ×3 IMPLANT
KIT BASIN OR (CUSTOM PROCEDURE TRAY) ×3 IMPLANT
KIT TURNOVER KIT A (KITS) ×3 IMPLANT
MAT PREVALON FULL STRYKER (MISCELLANEOUS) ×3 IMPLANT
POUCH RETRIEVAL ECOSAC 10 (ENDOMECHANICALS) ×1 IMPLANT
POUCH RETRIEVAL ECOSAC 10MM (ENDOMECHANICALS) ×2
SCISSORS LAP 5X45 EPIX DISP (ENDOMECHANICALS) ×3 IMPLANT
SET IRRIG TUBING LAPAROSCOPIC (IRRIGATION / IRRIGATOR) ×3 IMPLANT
SET TUBE SMOKE EVAC HIGH FLOW (TUBING) ×3 IMPLANT
SLEEVE XCEL OPT CAN 5 100 (ENDOMECHANICALS) ×3 IMPLANT
STRIP CLOSURE SKIN 1/2X4 (GAUZE/BANDAGES/DRESSINGS) IMPLANT
SUT MNCRL AB 4-0 PS2 18 (SUTURE) ×9 IMPLANT
SYR 20ML LL LF (SYRINGE) ×3 IMPLANT
TOWEL OR 17X26 10 PK STRL BLUE (TOWEL DISPOSABLE) ×3 IMPLANT
TRAY LAPAROSCOPIC (CUSTOM PROCEDURE TRAY) ×3 IMPLANT
TROCAR BLADELESS OPT 5 100 (ENDOMECHANICALS) ×3 IMPLANT
TROCAR XCEL BLUNT TIP 100MML (ENDOMECHANICALS) ×3 IMPLANT
TROCAR XCEL NON-BLD 11X100MML (ENDOMECHANICALS) ×3 IMPLANT

## 2020-06-10 NOTE — Op Note (Signed)
Emily Banks  Primary Care Physician:  Doreene Nest, NP    06/10/2020  10:40 AM  Procedure: Laparoscopic Cholecystectomy with intraoperative cholangiogram(not completed)  Surgeon: Susy Frizzle B. Daphine Deutscher, MD, FACS Asst:  Ivar Drape, MD  Anes:  General  Drains:  None  Findings: Chronic cholecystitis with 2 cm gallstones  Description of Procedure: The patient was taken to OR 4 and given general anesthesia.  The patient was prepped with chlorohexidine prep and draped sterilely. A time out was performed including identifying the patient and discussing their procedure.  Access to the abdomen was achieved with a 5 mm Optiview through the right upper quadrant.  Port placement included three 5 mm trocars and one 10 mm in the upper midline.  .    The gallbladder was visualized and appeared to have chronic adhesions and these were taken down sharply.   The fundus of the gallbaldder was grasped and the gallbladder was elevated. Traction on the infundibulum allowed for successful demonstration of the critical view. Inflammatory changes were chronic.  The cystic duct was identified and clipped up on the gallbladder and an incision was made in the cystic duct and the Reddick catheter was attempted to be inserted after milking the cystic duct of any debris. I was unable to cannulate the cystic duct.     The cystic duct was then triple clipped and divided, the cystic artery was double clipped and divided and then the gallbladder was removed from the gallbladder bed. Removal of the gallbladder from the gallbladder bed was performed without entering it.  The gallbladder was then placed in a bag and brought out through the upper midline . The gallbladder bed was inspected and no bleeding or bile leaks were seen.   Laparoscopic visualization was used when closing fascial defects for trocar sites.   Incisions were injected with Exparel and closed with 4-0 Monocryl and Dermbond on the skin.  Sponge and  needle count were correct.    The patient was taken to the recovery room in satisfactory condition.

## 2020-06-10 NOTE — Anesthesia Postprocedure Evaluation (Signed)
Anesthesia Post Note  Patient: Catie Chiao  Procedure(s) Performed: LAPAROSCOPIC CHOLECYSTECTOMY (N/A Abdomen)     Patient location during evaluation: Phase II Anesthesia Type: General Level of consciousness: awake Pain management: pain level controlled Vital Signs Assessment: post-procedure vital signs reviewed and stable Respiratory status: spontaneous breathing Cardiovascular status: stable Postop Assessment: no apparent nausea or vomiting Anesthetic complications: no   No complications documented.  Last Vitals:  Vitals:   06/10/20 1130 06/10/20 1140  BP: (!) 147/82 133/77  Pulse: 70 (!) 58  Resp: 13 16  Temp:  (!) 36.1 C  SpO2: 98% 98%    Last Pain:  Vitals:   06/10/20 1140  TempSrc:   PainSc: 0-No pain                 John F Scharlene Corn

## 2020-06-10 NOTE — Interval H&P Note (Signed)
History and Physical Interval Note:  06/10/2020 8:46 AM  Emily Banks  has presented today for surgery, with the diagnosis of chroinic choleyitis.  The various methods of treatment have been discussed with the patient and family. After consideration of risks, benefits and other options for treatment, the patient has consented to  Procedure(s): LAPAROSCOPIC CHOLECYSTECTOMY WITH INTRAOPERATIVE CHOLANGIOGRAM (N/A) as a surgical intervention.  The patient's history has been reviewed, patient examined, no change in status, stable for surgery.  I have reviewed the patient's chart and labs.  Questions were answered to the patient's satisfaction.     Valarie Merino

## 2020-06-10 NOTE — Anesthesia Preprocedure Evaluation (Signed)
Anesthesia Evaluation  Patient identified by MRN, date of birth, ID band Patient awake    Reviewed: Allergy & Precautions, NPO status , Patient's Chart, lab work & pertinent test results  Airway Mallampati: III       Dental no notable dental hx.    Pulmonary former smoker,    Pulmonary exam normal        Cardiovascular negative cardio ROS Normal cardiovascular exam     Neuro/Psych PSYCHIATRIC DISORDERS Anxiety Depression    GI/Hepatic negative GI ROS,   Endo/Other  diabetes, Type 2, Oral Hypoglycemic AgentsMorbid obesity  Renal/GU negative Renal ROS  negative genitourinary   Musculoskeletal negative musculoskeletal ROS (+)   Abdominal (+) + obese,   Peds  Hematology negative hematology ROS (+)   Anesthesia Other Findings   Reproductive/Obstetrics                             Anesthesia Physical Anesthesia Plan  ASA: III  Anesthesia Plan: General   Post-op Pain Management:    Induction: Intravenous  PONV Risk Score and Plan: 4 or greater and Ondansetron, Dexamethasone and Scopolamine patch - Pre-op  Airway Management Planned: Oral ETT  Additional Equipment: None  Intra-op Plan:   Post-operative Plan: Extubation in OR  Informed Consent: I have reviewed the patients History and Physical, chart, labs and discussed the procedure including the risks, benefits and alternatives for the proposed anesthesia with the patient or authorized representative who has indicated his/her understanding and acceptance.     Dental advisory given  Plan Discussed with: CRNA  Anesthesia Plan Comments:         Anesthesia Quick Evaluation

## 2020-06-10 NOTE — Transfer of Care (Signed)
Immediate Anesthesia Transfer of Care Note  Patient: Emily Banks  Procedure(s) Performed: LAPAROSCOPIC CHOLECYSTECTOMY (N/A Abdomen)  Patient Location: PACU  Anesthesia Type:General  Level of Consciousness: awake, alert , oriented and patient cooperative  Airway & Oxygen Therapy: Patient Spontanous Breathing and Patient connected to face mask  Post-op Assessment: Report given to RN and Post -op Vital signs reviewed and stable  Post vital signs: Reviewed and stable  Last Vitals:  Vitals Value Taken Time  BP    Temp    Pulse 60 06/10/20 1045  Resp 9 06/10/20 1045  SpO2 100 % 06/10/20 1045  Vitals shown include unvalidated device data.  Last Pain:  Vitals:   06/10/20 0822  TempSrc:   PainSc: 0-No pain         Complications: No complications documented.

## 2020-06-10 NOTE — Anesthesia Procedure Notes (Signed)
Procedure Name: Intubation Date/Time: 06/10/2020 9:18 AM Performed by: Vanessa Pewamo, CRNA Pre-anesthesia Checklist: Patient identified, Emergency Drugs available, Suction available and Patient being monitored Patient Re-evaluated:Patient Re-evaluated prior to induction Oxygen Delivery Method: Circle system utilized Preoxygenation: Pre-oxygenation with 100% oxygen Induction Type: IV induction Ventilation: Mask ventilation without difficulty Laryngoscope Size: 2 and Miller Grade View: Grade I Tube type: Oral Tube size: 7.5 mm Number of attempts: 1 Airway Equipment and Method: Stylet Placement Confirmation: ETT inserted through vocal cords under direct vision,  positive ETCO2,  breath sounds checked- equal and bilateral and CO2 detector Secured at: 21 cm Tube secured with: Tape Dental Injury: Teeth and Oropharynx as per pre-operative assessment

## 2020-06-10 NOTE — Discharge Instructions (Signed)

## 2020-06-13 ENCOUNTER — Encounter (HOSPITAL_COMMUNITY): Payer: Self-pay | Admitting: Surgery

## 2020-06-13 LAB — SURGICAL PATHOLOGY

## 2020-06-23 DIAGNOSIS — Z1152 Encounter for screening for COVID-19: Secondary | ICD-10-CM | POA: Diagnosis not present

## 2020-06-23 DIAGNOSIS — Z03818 Encounter for observation for suspected exposure to other biological agents ruled out: Secondary | ICD-10-CM | POA: Diagnosis not present

## 2020-06-29 ENCOUNTER — Telehealth: Payer: BC Managed Care – PPO | Admitting: Family

## 2020-06-29 DIAGNOSIS — J019 Acute sinusitis, unspecified: Secondary | ICD-10-CM | POA: Diagnosis not present

## 2020-06-29 MED ORDER — AMOXICILLIN-POT CLAVULANATE 875-125 MG PO TABS: 1.0000 | ORAL_TABLET | Freq: Two times a day (BID) | ORAL | 0 refills | Status: DC

## 2020-06-29 NOTE — Progress Notes (Signed)

## 2020-06-29 NOTE — Progress Notes (Signed)
See previous Evisit.   Jerred Zaremba, FNP  

## 2020-06-30 ENCOUNTER — Telehealth (INDEPENDENT_AMBULATORY_CARE_PROVIDER_SITE_OTHER): Payer: BC Managed Care – PPO | Admitting: Family Medicine

## 2020-06-30 ENCOUNTER — Encounter: Payer: Self-pay | Admitting: Family Medicine

## 2020-06-30 ENCOUNTER — Other Ambulatory Visit: Payer: Self-pay

## 2020-06-30 DIAGNOSIS — R059 Cough, unspecified: Secondary | ICD-10-CM | POA: Diagnosis not present

## 2020-06-30 MED ORDER — GUAIFENESIN-CODEINE 100-10 MG/5ML PO SYRP
5.0000 mL | ORAL_SOLUTION | Freq: Every evening | ORAL | 0 refills | Status: DC | PRN
Start: 2020-06-30 — End: 2020-10-13

## 2020-06-30 NOTE — Progress Notes (Signed)
VIRTUAL VISIT Due to national recommendations of social distancing due to COVID 19, a virtual visit is felt to be most appropriate for this patient at this time.   I connected with the patient on 06/30/20 at  3:00 PM EST by virtual telehealth platform and verified that I am speaking with the correct person using two identifiers.  Interactive audio and video telecommunications were attempted between this provider and patient, however failed, due to patient having technical difficulties OR patient did not have access to video capability.  We continued and completed visit with audio only.     I discussed the limitations, risks, security and privacy concerns of performing an evaluation and management service by  virtual telehealth platform and the availability of in person appointments. I also discussed with the patient that there may be a patient responsible charge related to this service. The patient expressed understanding and agreed to proceed.  Patient location: Home Provider Location: [facility] Participants: Kerby Nora and Roxy Horseman   Chief Complaint  Patient presents with  . Cough    Worse at night. Had E-Visit yesterday and was given Augmentin. Patient is requesting cough medicine to help at night  . Hoarse         History of Present Illness:  39 year old female pt of Jae Dire Clark'spresents with cough and hoarse voice.  She had an Evisit on 06/29/2020 and was dx with  Acute sinusitis.  Started on Augmentin last night. Robitussin and cough drops, theraflu at night.  Negative COVID test last week. Has been having 1 week  Congested nose, post nasal drip, headache.. she is coughing so much at night it makes her hoarse, cannot talk.  No fever, no mild SOB going up stairs and occ wheeze.       No chronic lung disease, nonsmoker  S/P 2 Moderna  COVID 19 screen No recent travel or known exposure to COVID19   The importance of social distancing was discussed today.    Review of Systems  All other systems reviewed and are negative.     Past Medical History:  Diagnosis Date  . Anxiety and depression   . Borderline diabetes   . Ectopic pregnancy 2016  . Fatty liver   . PCOS (polycystic ovarian syndrome)   . Pre-diabetes     reports that she has quit smoking. She has never used smokeless tobacco. She reports current alcohol use. She reports that she does not use drugs.   Current Outpatient Medications:  .  amoxicillin-clavulanate (AUGMENTIN) 875-125 MG tablet, Take 1 tablet by mouth 2 (two) times daily., Disp: 14 tablet, Rfl: 0 .  hydrOXYzine (ATARAX/VISTARIL) 10 MG tablet, Take 1-2 tablets (10-20 mg total) by mouth 2 (two) times daily as needed for anxiety., Disp: 60 tablet, Rfl: 0 .  metFORMIN (GLUCOPHAGE-XR) 500 MG 24 hr tablet, Take 1 tablet (500 mg total) by mouth daily with breakfast., Disp: 90 tablet, Rfl: 3 .  sertraline (ZOLOFT) 100 MG tablet, Take 1 tablet (100 mg total) by mouth daily. For anxiety and depression., Disp: 90 tablet, Rfl: 3   Observations/Objective: There were no vitals taken for this visit.  Physical Exam  Physical Exam Constitutional:      General: The patient is not in acute distress. Pulmonary:     Effort: Pulmonary effort is normal. No respiratory distress.  Neurological:     Mental Status: The patient is alert and oriented to person, place, and time.  Psychiatric:  Mood and Affect: Mood normal.        Behavior: Behavior normal.   Assessment and Plan    I discussed the assessment and treatment plan with the patient. The patient was provided an opportunity to ask questions and all were answered. The patient agreed with the plan and demonstrated an understanding of the instructions.   The patient was advised to call back or seek an in-person evaluation if the symptoms worsen or if the condition fails to improve as anticipated.  Total visit time 10 minutes, > 50% spent counseling and cordinating patients  care.    Problem List Items Addressed This Visit    Cough - Primary     Complete course of antibiotics but given continued symptoms consider repeat COVID screen.  Treat with cough suppressant to assist with rest at night.  rest fluids and symptomatic care.           Kerby Nora, MD

## 2020-08-10 DIAGNOSIS — Z20822 Contact with and (suspected) exposure to covid-19: Secondary | ICD-10-CM | POA: Diagnosis not present

## 2020-08-10 DIAGNOSIS — Z03818 Encounter for observation for suspected exposure to other biological agents ruled out: Secondary | ICD-10-CM | POA: Diagnosis not present

## 2020-08-31 DIAGNOSIS — R059 Cough, unspecified: Secondary | ICD-10-CM | POA: Insufficient documentation

## 2020-08-31 DIAGNOSIS — R051 Acute cough: Secondary | ICD-10-CM | POA: Insufficient documentation

## 2020-08-31 NOTE — Assessment & Plan Note (Signed)
Complete course of antibiotics but given continued symptoms consider repeat COVID screen.  Treat with cough suppressant to assist with rest at night.  rest fluids and symptomatic care.

## 2020-10-12 DIAGNOSIS — N939 Abnormal uterine and vaginal bleeding, unspecified: Secondary | ICD-10-CM

## 2020-10-12 DIAGNOSIS — E282 Polycystic ovarian syndrome: Secondary | ICD-10-CM

## 2020-10-12 DIAGNOSIS — E119 Type 2 diabetes mellitus without complications: Secondary | ICD-10-CM

## 2020-10-13 ENCOUNTER — Encounter: Payer: Self-pay | Admitting: Primary Care

## 2020-10-13 ENCOUNTER — Ambulatory Visit
Admission: RE | Admit: 2020-10-13 | Discharge: 2020-10-13 | Disposition: A | Discharge status: Home / Self Care | Payer: BC Managed Care – PPO | Source: Ambulatory Visit | Attending: Primary Care | Admitting: Primary Care

## 2020-10-13 ENCOUNTER — Ambulatory Visit: Payer: BC Managed Care – PPO | Admitting: Primary Care

## 2020-10-13 ENCOUNTER — Other Ambulatory Visit: Payer: Self-pay

## 2020-10-13 ENCOUNTER — Other Ambulatory Visit: Payer: Self-pay | Admitting: Primary Care

## 2020-10-13 VITALS — BP 130/84 | HR 62 | Temp 97.1°F | Ht 66.0 in | Wt 281.0 lb

## 2020-10-13 DIAGNOSIS — Z1159 Encounter for screening for other viral diseases: Secondary | ICD-10-CM | POA: Diagnosis not present

## 2020-10-13 DIAGNOSIS — N939 Abnormal uterine and vaginal bleeding, unspecified: Secondary | ICD-10-CM | POA: Diagnosis not present

## 2020-10-13 DIAGNOSIS — N83202 Unspecified ovarian cyst, left side: Secondary | ICD-10-CM | POA: Diagnosis not present

## 2020-10-13 DIAGNOSIS — Z114 Encounter for screening for human immunodeficiency virus [HIV]: Secondary | ICD-10-CM

## 2020-10-13 DIAGNOSIS — R109 Unspecified abdominal pain: Secondary | ICD-10-CM | POA: Diagnosis not present

## 2020-10-13 DIAGNOSIS — R102 Pelvic and perineal pain: Secondary | ICD-10-CM | POA: Diagnosis not present

## 2020-10-13 DIAGNOSIS — N83201 Unspecified ovarian cyst, right side: Secondary | ICD-10-CM | POA: Diagnosis not present

## 2020-10-13 HISTORY — DX: Abnormal uterine and vaginal bleeding, unspecified: N93.9

## 2020-10-13 HISTORY — DX: Pelvic and perineal pain: R10.2

## 2020-10-13 LAB — CBC WITH DIFFERENTIAL/PLATELET
Basophils Absolute: 0.1 10*3/uL (ref 0.0–0.1)
Basophils Relative: 1.2 % (ref 0.0–3.0)
Eosinophils Absolute: 0.2 10*3/uL (ref 0.0–0.7)
Eosinophils Relative: 2.4 % (ref 0.0–5.0)
HCT: 41.8 % (ref 36.0–46.0)
Hemoglobin: 14.2 g/dL (ref 12.0–15.0)
Lymphocytes Relative: 24 % (ref 12.0–46.0)
Lymphs Abs: 2.5 10*3/uL (ref 0.7–4.0)
MCHC: 34 g/dL (ref 30.0–36.0)
MCV: 86.8 fl (ref 78.0–100.0)
Monocytes Absolute: 0.7 10*3/uL (ref 0.1–1.0)
Monocytes Relative: 6.8 % (ref 3.0–12.0)
Neutro Abs: 6.8 10*3/uL (ref 1.4–7.7)
Neutrophils Relative %: 65.6 % (ref 43.0–77.0)
Platelets: 306 10*3/uL (ref 150.0–400.0)
RBC: 4.81 Mil/uL (ref 3.87–5.11)
RDW: 13.6 % (ref 11.5–15.5)
WBC: 10.3 10*3/uL (ref 4.0–10.5)

## 2020-10-13 LAB — POC URINALSYSI DIPSTICK (AUTOMATED)
Bilirubin, UA: NEGATIVE
Blood, UA: 3
Glucose, UA: POSITIVE — AB
Leukocytes, UA: NEGATIVE
Nitrite, UA: NEGATIVE
Protein, UA: POSITIVE — AB
Spec Grav, UA: 1.02 (ref 1.010–1.025)
Urobilinogen, UA: NEGATIVE E.U./dL — AB
pH, UA: 6 (ref 5.0–8.0)

## 2020-10-13 LAB — COMPREHENSIVE METABOLIC PANEL
ALT: 35 U/L (ref 0–35)
AST: 26 U/L (ref 0–37)
Albumin: 4.6 g/dL (ref 3.5–5.2)
Alkaline Phosphatase: 80 U/L (ref 39–117)
BUN: 11 mg/dL (ref 6–23)
CO2: 26 mEq/L (ref 19–32)
Calcium: 9.4 mg/dL (ref 8.4–10.5)
Chloride: 106 mEq/L (ref 96–112)
Creatinine, Ser: 1.03 mg/dL (ref 0.40–1.20)
GFR: 68.26 mL/min (ref >60.00)
Glucose, Bld: 141 mg/dL — ABNORMAL HIGH (ref 70–99)
Potassium: 4.2 mEq/L (ref 3.5–5.1)
Sodium: 138 mEq/L (ref 135–145)
Total Bilirubin: 0.4 mg/dL (ref 0.2–1.2)
Total Protein: 7.6 g/dL (ref 6.0–8.3)

## 2020-10-13 LAB — POCT URINE PREGNANCY: Preg Test, Ur: NEGATIVE

## 2020-10-13 MED ORDER — MEDROXYPROGESTERONE ACETATE 10 MG PO TABS: 20.0000 mg | ORAL_TABLET | Freq: Three times a day (TID) | ORAL | 0 refills | Status: DC

## 2020-10-13 MED ORDER — KETOROLAC TROMETHAMINE 60 MG/2ML IM SOLN
60.0000 mg | Freq: Once | INTRAMUSCULAR | Status: AC
  Administered 2020-10-13: 60 mg via INTRAMUSCULAR

## 2020-10-13 NOTE — Progress Notes (Signed)
Subjective:    Patient ID: Emily Banks, female    DOB: 07/08/1981, 40 y.o.   MRN: [id number]  HPI  Lakyn Alsteen is a very pleasant 40 y.o. female with a history of PCOS, type 2 diabetes, biliary colic who presents today with chief complaint of pelvic pain and vaginal bleeding.  Her pain is located to the right lower back with radiation to right groin which began four days ago. She was doing chores at the time, noticed a subtle pain initially but shortly after noticed severe pain and began bleeding vaginally. Also with pelvic pressure.  Her vaginal bleeding has been heavy for the last four days, has been through an entire pack of tampons and pads, large blood clots. Long history of irregular periods, last period was about 2 years ago.   She denies urinary frequency, dysuria, fevers, nausea, vomiting. She admits to drinking a good amount of water daily. She is eating and drinking well.    Review of Systems  Constitutional: Negative for fever.  Gastrointestinal: Negative for abdominal pain, nausea and vomiting.  Genitourinary: Positive for pelvic pain and vaginal bleeding. Negative for dysuria and frequency.  Musculoskeletal: Positive for back pain.         Past Medical History:  Diagnosis Date  . Anxiety and depression   . Borderline diabetes   . Ectopic pregnancy 2016  . Fatty liver   . PCOS (polycystic ovarian syndrome)   . Pre-diabetes     Social History   Socioeconomic History  . Marital status: Married    Spouse name: Not on file  . Number of children: Not on file  . Years of education: Not on file  . Highest education level: Not on file  Occupational History  . Not on file  Tobacco Use  . Smoking status: Former Games developer  . Smokeless tobacco: Never Used  Vaping Use  . Vaping Use: Every day  . Start date: 06/04/2015  Substance and Sexual Activity  . Alcohol use: Yes    Alcohol/week: 0.0 standard drinks    Comment: soical  . Drug use: Never  .  Sexual activity: Not on file  Other Topics Concern  . Not on file  Social History Narrative   Married.   No children.   Works at [employer].   Enjoys playing games, spending time with family.   Social Determinants of Health   Financial Resource Strain: Not on file  Food Insecurity: Not on file  Transportation Needs: Not on file  Physical Activity: Not on file  Stress: Not on file  Social Connections: Not on file  Intimate Partner Violence: Not on file    Past Surgical History:  Procedure Laterality Date  . CHOLECYSTECTOMY N/A 06/10/2020   Procedure: LAPAROSCOPIC CHOLECYSTECTOMY;  Surgeon: Luretha Murphy, MD;  Location: [facility];  Service: General;  Laterality: N/A;  . DILATION AND CURETTAGE OF UTERUS      Family History  Problem Relation Age of Onset  . Diabetes Father   . Hypertension Father   . Heart disease Father   . Melanoma Father   . Hyperlipidemia Mother   . Diabetes Brother   . Hypertension Brother   . Ovarian cancer Paternal Aunt   . Esophageal cancer Maternal Grandmother     No Known Allergies  Current Outpatient Medications on File Prior to Visit  Medication Sig Dispense Refill  . hydrOXYzine (ATARAX/VISTARIL) 10 MG tablet Take 1-2 tablets (10-20 mg total) by mouth 2 (two) times daily as needed for  anxiety. 60 tablet 0  . metFORMIN (GLUCOPHAGE-XR) 500 MG 24 hr tablet Take 1 tablet (500 mg total) by mouth daily with breakfast. 90 tablet 3  . sertraline (ZOLOFT) 100 MG tablet Take 1 tablet (100 mg total) by mouth daily. For anxiety and depression. 90 tablet 3   No current facility-administered medications on file prior to visit.    BP 130/84   Pulse 62   Temp (!) 97.1 F (36.2 C) (Temporal)   Ht 5\' 6"  (1.676 m)   Wt 281 lb (127.5 kg)   SpO2 98%   BMI 45.35 kg/m  Objective:   Physical Exam Constitutional:      General: She is in acute distress.     Comments: Appears uncomfortable sitting in exam room.  Pulmonary:     Effort: Pulmonary  effort is normal.  Abdominal:     Palpations: Abdomen is soft.     Tenderness: There is no abdominal tenderness. There is no right CVA tenderness, left CVA tenderness or guarding.  Neurological:     Mental Status: She is alert.           Assessment & Plan:      This visit occurred during the SARS-CoV-2 public health emergency.  Safety protocols were in place, including screening questions prior to the visit, additional usage of staff PPE, and extensive cleaning of exam room while observing appropriate contact time as indicated for disinfecting solutions.

## 2020-10-13 NOTE — Patient Instructions (Signed)
Stop by the lab prior to leaving today. I will notify you of your results once received.   Please meet with with either Ashtyn or Anastasiya regarding your ultrasound.   It was a pleasure to see you today!   

## 2020-10-13 NOTE — Assessment & Plan Note (Addendum)
Acute for the last four days, constant since, no resolve.   UA today negative except for bleeding. Urine pregnancy negative.  Checking CBC with Diff, CMP. Stat pelvic/transvaginal ultrasound pending.  Consider CT pelvis if ultrasound negative.   Differentials include ruptured ovarian cyst, rule out ovarian torsion, dysmenorrhea, fibroid. Less likely renal stone given vaginal bleeding.   IM Toradol 60 mg provided today.

## 2020-10-13 NOTE — Assessment & Plan Note (Signed)
Acute for the last four days, is going through boxes of tampons and pads, large clots.  Stat transvaginal/pelvic ultrasound ordered. CBC and CMP pending.  Urine pregnancy negative. UA negative except for blood.  She is uncomfortable today, but stable. Await results. Consider CT pelvis if needed.

## 2020-10-14 ENCOUNTER — Other Ambulatory Visit (INDEPENDENT_AMBULATORY_CARE_PROVIDER_SITE_OTHER): Payer: BC Managed Care – PPO

## 2020-10-14 DIAGNOSIS — R739 Hyperglycemia, unspecified: Secondary | ICD-10-CM

## 2020-10-14 LAB — HEMOGLOBIN A1C: Hgb A1c MFr Bld: 6.8 % — ABNORMAL HIGH (ref 4.6–6.5)

## 2020-10-14 LAB — HEPATITIS C ANTIBODY
Hepatitis C Ab: NONREACTIVE
SIGNAL TO CUT-OFF: 0 (ref <1.00)

## 2020-10-14 LAB — HIV ANTIBODY (ROUTINE TESTING W REFLEX): HIV 1&2 Ab, 4th Generation: NONREACTIVE

## 2020-10-16 MED ORDER — NORETHINDRONE ACET-ETHINYL EST 1-20 MG-MCG PO TABS: 1.0000 | ORAL_TABLET | Freq: Every day | ORAL | 1 refills | Status: DC

## 2020-10-16 MED ORDER — OZEMPIC (0.25 OR 0.5 MG/DOSE) 2 MG/1.5ML ~~LOC~~ SOPN: 0.2500 mg | PEN_INJECTOR | SUBCUTANEOUS | 0 refills | Status: DC

## 2020-10-16 NOTE — Telephone Encounter (Signed)
Emily Banks, please call pharmacy, cancel medroxyprogesterone Rx, she declined taking and wanted to start OCP's.   Also, needs to be scheduled for 3 month follow up, please ensure she gets scheduled. This is important.

## 2020-10-19 NOTE — Telephone Encounter (Signed)
Called pharmacy cancelled script no app made will hold one more day to see if made.

## 2020-11-27 ENCOUNTER — Other Ambulatory Visit: Payer: Self-pay

## 2020-11-27 DIAGNOSIS — E119 Type 2 diabetes mellitus without complications: Secondary | ICD-10-CM

## 2020-11-28 ENCOUNTER — Other Ambulatory Visit: Payer: Self-pay

## 2020-11-28 DIAGNOSIS — E119 Type 2 diabetes mellitus without complications: Secondary | ICD-10-CM

## 2020-11-30 NOTE — Telephone Encounter (Signed)
Left message to return call to our office.  

## 2020-11-30 NOTE — Telephone Encounter (Signed)
She needs a follow up office visit scheduled for late June/early July for general follow up/CPE. Once scheduled then okay to send in refill as pended.

## 2020-12-09 NOTE — Telephone Encounter (Signed)
Left message to return call to our office.  

## 2020-12-11 ENCOUNTER — Other Ambulatory Visit: Payer: Self-pay | Admitting: Primary Care

## 2020-12-11 DIAGNOSIS — F419 Anxiety disorder, unspecified: Secondary | ICD-10-CM

## 2020-12-13 NOTE — Telephone Encounter (Signed)
Patient is overdue for CPE/follow up, this will be required prior to any further refills.  Please schedule.   Once scheduled then okay to send in #30, no refills as pended. Must be seen within 30 days for additional refills.

## 2021-01-03 ENCOUNTER — Ambulatory Visit: Payer: BC Managed Care – PPO | Admitting: Primary Care

## 2021-01-03 ENCOUNTER — Encounter: Payer: Self-pay | Admitting: Primary Care

## 2021-01-03 ENCOUNTER — Other Ambulatory Visit: Payer: Self-pay

## 2021-01-03 VITALS — BP 126/78 | HR 87 | Temp 98.0°F | Ht 66.0 in | Wt 272.0 lb

## 2021-01-03 DIAGNOSIS — E119 Type 2 diabetes mellitus without complications: Secondary | ICD-10-CM | POA: Diagnosis not present

## 2021-01-03 DIAGNOSIS — L309 Dermatitis, unspecified: Secondary | ICD-10-CM

## 2021-01-03 DIAGNOSIS — F419 Anxiety disorder, unspecified: Secondary | ICD-10-CM | POA: Diagnosis not present

## 2021-01-03 DIAGNOSIS — E282 Polycystic ovarian syndrome: Secondary | ICD-10-CM | POA: Diagnosis not present

## 2021-01-03 DIAGNOSIS — F32A Depression, unspecified: Secondary | ICD-10-CM

## 2021-01-03 HISTORY — DX: Dermatitis, unspecified: L30.9

## 2021-01-03 LAB — POCT GLYCOSYLATED HEMOGLOBIN (HGB A1C): Hemoglobin A1C: 5.8 % — AB (ref 4.0–5.6)

## 2021-01-03 MED ORDER — SERTRALINE HCL 100 MG PO TABS: 100.0000 mg | ORAL_TABLET | Freq: Every day | ORAL | 1 refills | Status: DC

## 2021-01-03 MED ORDER — OZEMPIC (0.25 OR 0.5 MG/DOSE) 2 MG/1.5ML ~~LOC~~ SOPN: 0.2500 mg | PEN_INJECTOR | SUBCUTANEOUS | 0 refills | Status: DC

## 2021-01-03 MED ORDER — TRIAMCINOLONE ACETONIDE 0.1 % EX CREA: 1.0000 "application " | TOPICAL_CREAM | Freq: Two times a day (BID) | CUTANEOUS | 0 refills | Status: DC

## 2021-01-03 NOTE — Progress Notes (Signed)
Subjective:    Patient ID: Emily Banks, female    DOB: [birth year], 40 y.o.   MRN: [id number]  HPI  Emily Banks is a very pleasant 40 y.o. female with a history of type 2 diabetes, PCOS, abnormal uterine bleeding who presents today for follow up of diabetes and to discuss itching. She is needing refills of Zoloft.   1) Type 2 Diabetes:  Current medications include: Metformin XR 500 mg daily and Ozempic 0.25 mg weekly. She is taking Metformin 1-2 times weekly on average as she forgets to take.   She is checking her blood glucose 0 times daily.   Last A1C: 6.8 Last Eye Exam:UTD Last Foot Exam: Due Pneumonia Vaccination: Due Urine Microalbumin: Due Statin: None   2) Itching/Rash: Began in late May 2022 to lower extremities as red bumps that will turn into "sores". Two days ago she noticed swelling, itching has intensified. She does have an old Rx of Triamcinolone 0.1%, was diagnosed with dermatitis per dermatology for same symptoms.   No new lotions, detergents, soaps or shampoos. No new medicines, vitamins, supplements. No new pets. No recent outdoor exposure or poison ivy exposure. No bonfire or smoke exposure.  No recent motel or hotel stay or new beds.   No fevers/chills, oral lesions, new joint pains, tick bites, abdominal pain, nausea.   She's been using antihistamines, cortisone cream, and neosporin without improvement.   She does shave her legs.    Review of Systems  Eyes:  Negative for visual disturbance.  Skin:  Positive for rash.       Itchy legs  Neurological:  Negative for numbness.  Psychiatric/Behavioral:  The patient is not nervous/anxious.         Past Medical History:  Diagnosis Date   Anxiety and depression    Borderline diabetes    Ectopic pregnancy 2016   Fatty liver    PCOS (polycystic ovarian syndrome)    Pre-diabetes     Social History   Socioeconomic History   Marital status: Married    Spouse name: Not on file   Number  of children: Not on file   Years of education: Not on file   Highest education level: Not on file  Occupational History   Not on file  Tobacco Use   Smoking status: Former    Pack years: 0.00   Smokeless tobacco: Never  Vaping Use   Vaping Use: Every day   Start date: 06/04/2015  Substance and Sexual Activity   Alcohol use: Yes    Alcohol/week: 0.0 standard drinks    Comment: soical   Drug use: Never   Sexual activity: Not on file  Other Topics Concern   Not on file  Social History Narrative   Married.   No children.   Works at [employer].   Enjoys playing games, spending time with family.   Social Determinants of Health   Financial Resource Strain: Not on file  Food Insecurity: Not on file  Transportation Needs: Not on file  Physical Activity: Not on file  Stress: Not on file  Social Connections: Not on file  Intimate Partner Violence: Not on file    Past Surgical History:  Procedure Laterality Date   CHOLECYSTECTOMY N/A 06/10/2020   Procedure: LAPAROSCOPIC CHOLECYSTECTOMY;  Surgeon: Luretha Murphy, MD;  Location: [facility];  Service: General;  Laterality: N/A;   DILATION AND CURETTAGE OF UTERUS      Family History  Problem Relation Age of Onset   Diabetes Father  Hypertension Father    Heart disease Father    Melanoma Father    Hyperlipidemia Mother    Diabetes Brother    Hypertension Brother    Ovarian cancer Paternal Aunt    Esophageal cancer Maternal Grandmother     No Known Allergies  Current Outpatient Medications on File Prior to Visit  Medication Sig Dispense Refill   hydrOXYzine (ATARAX/VISTARIL) 10 MG tablet Take 1-2 tablets (10-20 mg total) by mouth 2 (two) times daily as needed for anxiety. 60 tablet 0   metFORMIN (GLUCOPHAGE-XR) 500 MG 24 hr tablet Take 1 tablet (500 mg total) by mouth daily with breakfast. 90 tablet 3   norethindrone-ethinyl estradiol (JUNEL 1/20) 1-20 MG-MCG tablet Take 1 tablet by mouth daily. 84 tablet 1    Semaglutide,0.25 or 0.5MG /DOS, (OZEMPIC, 0.25 OR 0.5 MG/DOSE,) 2 MG/1.5ML SOPN Inject 0.25 mg into the skin once a week. For diabetes. 3 mL 0   sertraline (ZOLOFT) 100 MG tablet TAKE 1 TABLET (100 MG TOTAL) BY MOUTH DAILY. FOR ANXIETY AND DEPRESSION. 30 tablet 0   No current facility-administered medications on file prior to visit.    BP 126/78   Pulse 87   Temp 98 F (36.7 C) (Temporal)   Ht 5\' 6"  (1.676 m)   Wt 272 lb (123.4 kg)   SpO2 98%   BMI 43.90 kg/m  Objective:   Physical Exam Cardiovascular:     Rate and Rhythm: Normal rate and regular rhythm.  Pulmonary:     Effort: Pulmonary effort is normal.     Breath sounds: Normal breath sounds.  Musculoskeletal:     Cervical back: Neck supple.  Skin:    General: Skin is warm and dry.     Findings: Rash present.     Comments: Several small red/scabbed bumps to lower extremities bilaterally. No drainage.           Assessment & Plan:      This visit occurred during the SARS-CoV-2 public health emergency.  Safety protocols were in place, including screening questions prior to the visit, additional usage of staff PPE, and extensive cleaning of exam room while observing appropriate contact time as indicated for disinfecting solutions.

## 2021-01-03 NOTE — Assessment & Plan Note (Signed)
Prior history, diagnosed per dermatology.  Will refill Triamcinolone 0.1% cream. IM Depo Medrol 80 mg ordered.   Okay to use PRN hydroxyzine 10 mg.   She will update.

## 2021-01-03 NOTE — Assessment & Plan Note (Signed)
Now on OCP's is having regular menstrual cycles. Continue same.

## 2021-01-03 NOTE — Assessment & Plan Note (Signed)
Well controlled with A1C of 5.8!  She forgets to take Metformin and has hardly taken. Will discontinue for now.  Continue Ozempic 0.25 mg weekly. Refills provided. Foot exam today.  Urine micro due and pending.  Follow up in 6 months.

## 2021-01-03 NOTE — Patient Instructions (Addendum)
Apply the triamcinolone cream twice daily for about one week.  You may take the hydroxyzine as needed for itching.   Stop metformin for now.   Continue Ozempic weekly for diabetes.   Please schedule a follow up visit for 6 months for your annual physical.  It was a pleasure to see you today!

## 2021-01-03 NOTE — Assessment & Plan Note (Signed)
Doing well on Zoloft 100 mg and PRN hydroxyzine 10mg . Continue same.

## 2021-01-04 MED ORDER — METHYLPREDNISOLONE ACETATE 80 MG/ML IJ SUSP
80.0000 mg | Freq: Once | INTRAMUSCULAR | Status: AC
  Administered 2021-01-03: 03:00:00 80 mg via INTRAMUSCULAR

## 2021-01-04 NOTE — Addendum Note (Signed)
Addended by: Donnamarie Poag on: 01/04/2021 07:37 AM   Modules accepted: Orders

## 2021-01-05 NOTE — Telephone Encounter (Signed)
Can you find out what's going on with her Ozempic? See message.

## 2021-01-17 ENCOUNTER — Ambulatory Visit: Payer: BC Managed Care – PPO | Admitting: Primary Care

## 2021-01-31 ENCOUNTER — Other Ambulatory Visit: Payer: Self-pay | Admitting: Primary Care

## 2021-01-31 DIAGNOSIS — E119 Type 2 diabetes mellitus without complications: Secondary | ICD-10-CM

## 2021-02-15 ENCOUNTER — Telehealth: Payer: Self-pay

## 2021-02-21 NOTE — Telephone Encounter (Signed)
FYI regarding sertraline.

## 2021-02-21 NOTE — Telephone Encounter (Signed)
Received request for more information called to give over phone but insurance declined must be faxed in. Have faxed to number on form.

## 2021-02-21 NOTE — Telephone Encounter (Signed)
I am working on her ozempic. Think she needed new script for sertraline.

## 2021-02-24 NOTE — Telephone Encounter (Signed)
Received approval for Ozempic from 02/15/2021 to 02/15/2022. Letter sent to scan and my chart sent to patient.

## 2021-05-01 ENCOUNTER — Other Ambulatory Visit: Payer: Self-pay | Admitting: Primary Care

## 2021-05-01 DIAGNOSIS — E119 Type 2 diabetes mellitus without complications: Secondary | ICD-10-CM

## 2021-05-13 ENCOUNTER — Other Ambulatory Visit: Payer: Self-pay | Admitting: Primary Care

## 2021-05-13 DIAGNOSIS — E282 Polycystic ovarian syndrome: Secondary | ICD-10-CM

## 2021-05-13 DIAGNOSIS — N939 Abnormal uterine and vaginal bleeding, unspecified: Secondary | ICD-10-CM

## 2021-07-05 ENCOUNTER — Ambulatory Visit (INDEPENDENT_AMBULATORY_CARE_PROVIDER_SITE_OTHER): Payer: Self-pay | Admitting: Primary Care

## 2021-07-05 ENCOUNTER — Encounter: Payer: Self-pay | Admitting: Primary Care

## 2021-07-05 ENCOUNTER — Other Ambulatory Visit: Payer: Self-pay

## 2021-07-05 VITALS — BP 124/76 | HR 90 | Temp 98.6°F | Ht 66.0 in | Wt 273.0 lb

## 2021-07-05 DIAGNOSIS — Z23 Encounter for immunization: Secondary | ICD-10-CM

## 2021-07-05 DIAGNOSIS — E119 Type 2 diabetes mellitus without complications: Secondary | ICD-10-CM

## 2021-07-05 DIAGNOSIS — R051 Acute cough: Secondary | ICD-10-CM

## 2021-07-05 DIAGNOSIS — F32A Depression, unspecified: Secondary | ICD-10-CM

## 2021-07-05 DIAGNOSIS — F419 Anxiety disorder, unspecified: Secondary | ICD-10-CM

## 2021-07-05 LAB — MICROALBUMIN / CREATININE URINE RATIO
Creatinine,U: 141.1 mg/dL
Microalb Creat Ratio: 1.2 mg/g (ref 0.0–30.0)
Microalb, Ur: 1.7 mg/dL (ref 0.0–1.9)

## 2021-07-05 LAB — POCT GLYCOSYLATED HEMOGLOBIN (HGB A1C): Hemoglobin A1C: 6 % — AB (ref 4.0–5.6)

## 2021-07-05 MED ORDER — AZITHROMYCIN 250 MG PO TABS: ORAL_TABLET | ORAL | 0 refills | Status: DC

## 2021-07-05 MED ORDER — BENZONATATE 200 MG PO CAPS: 200.0000 mg | ORAL_CAPSULE | Freq: Three times a day (TID) | ORAL | 0 refills | Status: DC | PRN

## 2021-07-05 NOTE — Patient Instructions (Addendum)
Start Azithromycin antibiotics for infection. Take 2 tablets by mouth today, then 1 tablet daily for 4 additional days.  You may take Benzonatate capsules for cough. Take 1 capsule by mouth three times daily as needed for cough.  Stop by the lab prior to leaving today. I will notify you of your results once received.   Schedule a follow up visit for 6 months for your physical.  It was a pleasure to see you today!

## 2021-07-05 NOTE — Assessment & Plan Note (Signed)
Doing well on Zoloft 100 mg, continue same.  

## 2021-07-05 NOTE — Assessment & Plan Note (Signed)
Deep, congested cough during visit is suspicious, especially given duration of symptoms without much improvement.   Rx for Azithromycin 250 mg course sent to pharmacy. Rx for Tessalon Perles 200 mg sent to pharmacy.  She will update if no improvement.

## 2021-07-05 NOTE — Progress Notes (Signed)
Subjective:    Patient ID: Emily Banks, female    DOB: 10/04/1980, 40 y.o.   MRN: [id number]  HPI  Emily Banks is a very pleasant 40 y.o. female with a history of type 2 diabetes, PCOS, fatty liver, obesity who presents today for follow up of diabetes and anxiety/depression, and to discuss acute cough.  1) Type 2 Diabetes:  Current medications include: Ozempic 0.25 mg weekly. She has missed two weeks (not consecutive) as Ozempic has been out of stock at several pharmacies.   She is checking her blood glucose 0 times daily.  Last A1C: 5.8 in June 2022, 6.0 today Last Eye Exam: UTD Last Foot Exam: UTD Pneumonia Vaccination: Never completed  Urine Microalbumin: Due Statin: None  Dietary changes since last visit: Eating at home more often.   Exercise: None.   2) Anxiety and Depression: Currently managed on Zoloft 100 mg daily. Overall feels very well managed on this dose. Positive effects from Zoloft include feeling less anxious, able to managed stressful situations. Her father is on Zoloft too and does very well.    3) Cough: Acute for the last two weeks with nasal congestion, ear fullness, fatigue. Symptoms have wax and waned over the last week, felt worse yesterday, some better today.  She's been taking Robitussin and Mucinex.   Wt Readings from Last 3 Encounters:  07/05/21 273 lb (123.8 kg)  01/03/21 272 lb (123.4 kg)  10/13/20 281 lb (127.5 kg)     Review of Systems  Constitutional:  Positive for fatigue. Negative for fever.  HENT:  Positive for congestion, postnasal drip and rhinorrhea. Negative for ear pain.        Ear fullness  Respiratory:  Positive for cough. Negative for shortness of breath.   Cardiovascular:  Negative for chest pain.  Neurological:  Negative for numbness.  Psychiatric/Behavioral:  The patient is not nervous/anxious.         Past Medical History:  Diagnosis Date   Anxiety and depression    Borderline diabetes    Ectopic  pregnancy 2016   Fatty liver    PCOS (polycystic ovarian syndrome)    Pre-diabetes     Social History   Socioeconomic History   Marital status: Married    Spouse name: Not on file   Number of children: Not on file   Years of education: Not on file   Highest education level: Not on file  Occupational History   Not on file  Tobacco Use   Smoking status: Former   Smokeless tobacco: Never  Vaping Use   Vaping Use: Every day   Start date: 06/04/2015  Substance and Sexual Activity   Alcohol use: Yes    Alcohol/week: 0.0 standard drinks    Comment: soical   Drug use: Never   Sexual activity: Not on file  Other Topics Concern   Not on file  Social History Narrative   Married.   No children.   Works at [employer].   Enjoys playing games, spending time with family.   Social Determinants of Health   Financial Resource Strain: Not on file  Food Insecurity: Not on file  Transportation Needs: Not on file  Physical Activity: Not on file  Stress: Not on file  Social Connections: Not on file  Intimate Partner Violence: Not on file    Past Surgical History:  Procedure Laterality Date   CHOLECYSTECTOMY N/A 06/10/2020   Procedure: LAPAROSCOPIC CHOLECYSTECTOMY;  Surgeon: Luretha Murphy, MD;  Location: [facility];  Service: General;  Laterality: N/A;   DILATION AND CURETTAGE OF UTERUS      Family History  Problem Relation Age of Onset   Diabetes Father    Hypertension Father    Heart disease Father    Melanoma Father    Hyperlipidemia Mother    Diabetes Brother    Hypertension Brother    Ovarian cancer Paternal Aunt    Esophageal cancer Maternal Grandmother     No Known Allergies  Current Outpatient Medications on File Prior to Visit  Medication Sig Dispense Refill   hydrOXYzine (ATARAX/VISTARIL) 10 MG tablet Take 1-2 tablets (10-20 mg total) by mouth 2 (two) times daily as needed for anxiety. 60 tablet 0   norethindrone-ethinyl estradiol (JUNEL 1/20) 1-20 MG-MCG  tablet Take 1 tablet by mouth daily. Office visit required for further refills. 84 tablet 0   Semaglutide,0.25 or 0.5MG /DOS, (OZEMPIC, 0.25 OR 0.5 MG/DOSE,) 2 MG/1.5ML SOPN INJECT 0.25 MG INTO THE SKIN ONCE A WEEK. FOR DIABETES. 3 mL 0   sertraline (ZOLOFT) 100 MG tablet Take 1 tablet (100 mg total) by mouth daily. For anxiety and depression. 90 tablet 1   triamcinolone cream (KENALOG) 0.1 % Apply 1 application topically 2 (two) times daily. 80 g 0   No current facility-administered medications on file prior to visit.    BP 124/76    Pulse 90    Temp 98.6 F (37 C) (Temporal)    Ht 5\' 6"  (1.676 m)    Wt 273 lb (123.8 kg)    SpO2 98%    BMI 44.06 kg/m  Objective:   Physical Exam HENT:     Right Ear: Tympanic membrane and ear canal normal.     Left Ear: Ear canal normal.     Ears:     Comments: Left TM mildly red    Nose:     Right Sinus: No maxillary sinus tenderness or frontal sinus tenderness.     Left Sinus: No maxillary sinus tenderness or frontal sinus tenderness.     Mouth/Throat:     Pharynx: No posterior oropharyngeal erythema.  Eyes:     Conjunctiva/sclera: Conjunctivae normal.  Cardiovascular:     Rate and Rhythm: Normal rate and regular rhythm.  Pulmonary:     Effort: Pulmonary effort is normal.     Breath sounds: Normal breath sounds. No wheezing or rales.     Comments: Deep, congested cough noted several times during visit Musculoskeletal:     Cervical back: Neck supple.  Lymphadenopathy:     Cervical: No cervical adenopathy.  Skin:    General: Skin is warm and dry.  Psychiatric:        Mood and Affect: Mood normal.          Assessment & Plan:      This visit occurred during the SARS-CoV-2 public health emergency.  Safety protocols were in place, including screening questions prior to the visit, additional usage of staff PPE, and extensive cleaning of exam room while observing appropriate contact time as indicated for disinfecting solutions.

## 2021-07-05 NOTE — Assessment & Plan Note (Signed)
Controlled with A1C of 6.0 today.  Continue Ozempic 0.25 mg weekly.  Urine microalbumin pending. Will hold off on pneumonia vaccine today given acute respiratory symptoms.  Follow up in 6 months.

## 2021-08-05 ENCOUNTER — Other Ambulatory Visit: Payer: Self-pay | Admitting: Primary Care

## 2021-08-05 DIAGNOSIS — E282 Polycystic ovarian syndrome: Secondary | ICD-10-CM

## 2021-08-05 DIAGNOSIS — N939 Abnormal uterine and vaginal bleeding, unspecified: Secondary | ICD-10-CM

## 2021-08-15 ENCOUNTER — Other Ambulatory Visit: Payer: Self-pay | Admitting: Primary Care

## 2021-08-15 DIAGNOSIS — F419 Anxiety disorder, unspecified: Secondary | ICD-10-CM

## 2021-08-24 ENCOUNTER — Other Ambulatory Visit: Payer: Self-pay | Admitting: Primary Care

## 2021-08-24 DIAGNOSIS — E119 Type 2 diabetes mellitus without complications: Secondary | ICD-10-CM

## 2021-08-25 ENCOUNTER — Telehealth: Payer: Self-pay

## 2021-11-09 ENCOUNTER — Other Ambulatory Visit (HOSPITAL_COMMUNITY)
Admission: RE | Admit: 2021-11-09 | Discharge: 2021-11-09 | Disposition: A | Discharge status: Home / Self Care | Payer: 59 | Source: Ambulatory Visit | Attending: Primary Care | Admitting: Primary Care

## 2021-11-09 ENCOUNTER — Encounter: Payer: Self-pay | Admitting: Primary Care

## 2021-11-09 ENCOUNTER — Ambulatory Visit (INDEPENDENT_AMBULATORY_CARE_PROVIDER_SITE_OTHER): Payer: Self-pay | Admitting: Primary Care

## 2021-11-09 VITALS — BP 118/80 | HR 82 | Temp 97.0°F | Ht 65.75 in | Wt 272.0 lb

## 2021-11-09 DIAGNOSIS — Z23 Encounter for immunization: Secondary | ICD-10-CM

## 2021-11-09 DIAGNOSIS — Z124 Encounter for screening for malignant neoplasm of cervix: Secondary | ICD-10-CM | POA: Insufficient documentation

## 2021-11-09 DIAGNOSIS — F32A Depression, unspecified: Secondary | ICD-10-CM

## 2021-11-09 DIAGNOSIS — Z1231 Encounter for screening mammogram for malignant neoplasm of breast: Secondary | ICD-10-CM

## 2021-11-09 DIAGNOSIS — F419 Anxiety disorder, unspecified: Secondary | ICD-10-CM

## 2021-11-09 DIAGNOSIS — E1165 Type 2 diabetes mellitus with hyperglycemia: Secondary | ICD-10-CM

## 2021-11-09 DIAGNOSIS — N939 Abnormal uterine and vaginal bleeding, unspecified: Secondary | ICD-10-CM

## 2021-11-09 DIAGNOSIS — Z0000 Encounter for general adult medical examination without abnormal findings: Secondary | ICD-10-CM

## 2021-11-09 LAB — COMPREHENSIVE METABOLIC PANEL
ALT: 54 U/L — ABNORMAL HIGH (ref 0–35)
AST: 79 U/L — ABNORMAL HIGH (ref 0–37)
Albumin: 4.2 g/dL (ref 3.5–5.2)
Alkaline Phosphatase: 60 U/L (ref 39–117)
BUN: 12 mg/dL (ref 6–23)
CO2: 23 mEq/L (ref 19–32)
Calcium: 9.1 mg/dL (ref 8.4–10.5)
Chloride: 103 mEq/L (ref 96–112)
Creatinine, Ser: 1.1 mg/dL (ref 0.40–1.20)
GFR: 62.61 mL/min (ref >60.00)
Glucose, Bld: 132 mg/dL — ABNORMAL HIGH (ref 70–99)
Potassium: 3.8 mEq/L (ref 3.5–5.1)
Sodium: 136 mEq/L (ref 135–145)
Total Bilirubin: 0.5 mg/dL (ref 0.2–1.2)
Total Protein: 7.4 g/dL (ref 6.0–8.3)

## 2021-11-09 LAB — CBC
HCT: 41.5 % (ref 36.0–46.0)
Hemoglobin: 13.8 g/dL (ref 12.0–15.0)
MCHC: 33.3 g/dL (ref 30.0–36.0)
MCV: 87.6 fl (ref 78.0–100.0)
Platelets: 277 10*3/uL (ref 150.0–400.0)
RBC: 4.74 Mil/uL (ref 3.87–5.11)
RDW: 13 % (ref 11.5–15.5)
WBC: 9.1 10*3/uL (ref 4.0–10.5)

## 2021-11-09 LAB — LIPID PANEL
Cholesterol: 161 mg/dL (ref 0–200)
HDL: 48.4 mg/dL (ref >39.00)
LDL Cholesterol: 81 mg/dL (ref 0–99)
NonHDL: 113.07
Total CHOL/HDL Ratio: 3
Triglycerides: 160 mg/dL — ABNORMAL HIGH (ref 0.0–149.0)
VLDL: 32 mg/dL (ref 0.0–40.0)

## 2021-11-09 LAB — HEMOGLOBIN A1C: Hgb A1c MFr Bld: 6.3 % (ref 4.6–6.5)

## 2021-11-09 NOTE — Assessment & Plan Note (Signed)
Controlled. ?Less frequent use of hydroxyzine. ? ?Continue sertraline 100 mg daily and hydroxyzine 10 mg PRN.  ?

## 2021-11-09 NOTE — Patient Instructions (Signed)
Stop by the lab prior to leaving today. I will notify you of your results once received.  ? ?Call the [facility] to schedule your mammogram.  ? ? ?Please schedule a follow up visit for 6 months for a diabetes check. ? ?It was a pleasure to see you today! ? ?Preventive Care 2-41 Years Old, Female ?Preventive care refers to lifestyle choices and visits with your health care provider that can promote health and wellness. Preventive care visits are also called wellness exams. ?What can I expect for my preventive care visit? ?Counseling ?Your health care provider may ask you questions about your: ?Medical history, including: ?Past medical problems. ?Family medical history. ?Pregnancy history. ?Current health, including: ?Menstrual cycle. ?Method of birth control. ?Emotional well-being. ?Home life and relationship well-being. ?Sexual activity and sexual health. ?Lifestyle, including: ?Alcohol, nicotine or tobacco, and drug use. ?Access to firearms. ?Diet, exercise, and sleep habits. ?Work and work Statistician. ?Sunscreen use. ?Safety issues such as seatbelt and bike helmet use. ?Physical exam ?Your health care provider will check your: ?Height and weight. These may be used to calculate your BMI (body mass index). BMI is a measurement that tells if you are at a healthy weight. ?Waist circumference. This measures the distance around your waistline. This measurement also tells if you are at a healthy weight and may help predict your risk of certain diseases, such as type 2 diabetes and high blood pressure. ?Heart rate and blood pressure. ?Body temperature. ?Skin for abnormal spots. ?What immunizations do I need? ? ?Vaccines are usually given at various ages, according to a schedule. Your health care provider will recommend vaccines for you based on your age, medical history, and lifestyle or other factors, such as travel or where you work. ?What tests do I need? ?Screening ?Your health care provider may recommend  screening tests for certain conditions. This may include: ?Lipid and cholesterol levels. ?Diabetes screening. This is done by checking your blood sugar (glucose) after you have not eaten for a while (fasting). ?Pelvic exam and Pap test. ?Hepatitis B test. ?Hepatitis C test. ?HIV (human immunodeficiency virus) test. ?STI (sexually transmitted infection) testing, if you are at risk. ?Lung cancer screening. ?Colorectal cancer screening. ?Mammogram. Talk with your health care provider about when you should start having regular mammograms. This may depend on whether you have a family history of breast cancer. ?BRCA-related cancer screening. This may be done if you have a family history of breast, ovarian, tubal, or peritoneal cancers. ?Bone density scan. This is done to screen for osteoporosis. ?Talk with your health care provider about your test results, treatment options, and if necessary, the need for more tests. ?Follow these instructions at home: ?Eating and drinking ? ?Eat a diet that includes fresh fruits and vegetables, whole grains, lean protein, and low-fat dairy products. ?Take vitamin and mineral supplements as recommended by your health care provider. ?Do not drink alcohol if: ?Your health care provider tells you not to drink. ?You are pregnant, may be pregnant, or are planning to become pregnant. ?If you drink alcohol: ?Limit how much you have to 0-1 drink a day. ?Know how much alcohol is in your drink. In the [place] one drink equals one 12 oz bottle of beer (355 mL), one 5 oz glass of wine (148 mL), or one 1? oz glass of hard liquor (44 mL). ?Lifestyle ?Brush your teeth every morning and night with fluoride toothpaste. Floss one time each day. ?Exercise for at least 30 minutes 5 or more days each week. ?  Do not use any products that contain nicotine or tobacco. These products include cigarettes, chewing tobacco, and vaping devices, such as e-cigarettes. If you need help quitting, ask your health care  provider. ?Do not use drugs. ?If you are sexually active, practice safe sex. Use a condom or other form of protection to prevent STIs. ?If you do not wish to become pregnant, use a form of birth control. If you plan to become pregnant, see your health care provider for a prepregnancy visit. ?Take aspirin only as told by your health care provider. Make sure that you understand how much to take and what form to take. Work with your health care provider to find out whether it is safe and beneficial for you to take aspirin daily. ?Find healthy ways to manage stress, such as: ?Meditation, yoga, or listening to music. ?Journaling. ?Talking to a trusted person. ?Spending time with friends and family. ?Minimize exposure to UV radiation to reduce your risk of skin cancer. ?Safety ?Always wear your seat belt while driving or riding in a vehicle. ?Do not drive: ?If you have been drinking alcohol. Do not ride with someone who has been drinking. ?When you are tired or distracted. ?While texting. ?If you have been using any mind-altering substances or drugs. ?Wear a helmet and other protective equipment during sports activities. ?If you have firearms in your house, make sure you follow all gun safety procedures. ?Seek help if you have been physically or sexually abused. ?What's next? ?Visit your health care provider once a year for an annual wellness visit. ?Ask your health care provider how often you should have your eyes and teeth checked. ?Stay up to date on all vaccines. ?This information is not intended to replace advice given to you by your health care provider. Make sure you discuss any questions you have with your health care provider. ?Document Revised: 01/04/2021 Document Reviewed: 01/04/2021 ?Elsevier Patient Education ? [place]. ? ?

## 2021-11-09 NOTE — Assessment & Plan Note (Signed)
Pneumovax 23 provided today.  Other vaccines up-to-date. ?Pap smear due, completed and pending. ?Mammogram due, orders placed. ? ?Discussed the importance of a healthy diet and regular exercise in order for weight loss, and to reduce the risk of further co-morbidity. ? ?Exam today stable, labs pending. ?

## 2021-11-09 NOTE — Assessment & Plan Note (Signed)
Improved. ? ?Continue Junel 1/20 daily. ?Pap smear due today. ?

## 2021-11-09 NOTE — Addendum Note (Signed)
Addended by: Sherrilee Gilles B on: 11/09/2021 09:33 AM ? ? Modules accepted: Orders ? ?

## 2021-11-09 NOTE — Assessment & Plan Note (Signed)
Repeat A1c pending. ? ?Continue semaglutide 0.25 mg weekly. ? ?Pneumovax provided today. ?Urine microalbumin up-to-date. ?Eye and foot exams up-to-date. ? ?Not managed on statin. ? ?Follow up in 6 months. ?

## 2021-11-09 NOTE — Progress Notes (Signed)
? ?Subjective:  ? ? Patient ID: Emily Banks, female    DOB: March 26, 1981, 41 y.o.   MRN: [id number] ? ?HPI ? ?Donique Hogenson is a very pleasant 41 y.o. female who presents today for complete physical and follow up of chronic conditions. ? ?Immunizations: ?-Tetanus: 2020 ?-Influenza: Completed last season ?-Covid-19: 2 vaccines ?-Pneumonia: Never completed  ? ?Diet: Fair diet.  ?Exercise: Walking ? ?Eye exam: Completes annually  ?Dental exam: Completes semi-annually  ? ?Pap Smear: Completed in February 2020 ?Mammogram: Never completed  ? ? ?BP Readings from Last 3 Encounters:  ?11/09/21 118/80  ?07/05/21 124/76  ?01/03/21 126/78  ? ? ? ? ?Review of Systems  ?Constitutional:  Negative for unexpected weight change.  ?HENT:  Negative for rhinorrhea.   ?Respiratory:  Negative for cough and shortness of breath.   ?Cardiovascular:  Negative for chest pain.  ?Gastrointestinal:  Negative for constipation and diarrhea.  ?Genitourinary:  Negative for difficulty urinating and menstrual problem.  ?Musculoskeletal:  Negative for arthralgias and myalgias.  ?Skin:  Negative for rash.  ?Allergic/Immunologic: Negative for environmental allergies.  ?Neurological:  Negative for dizziness and headaches.  ?Psychiatric/Behavioral:  The patient is not nervous/anxious.   ? ?   ? ? ?Past Medical History:  ?Diagnosis Date  ? Anxiety and depression   ? Borderline diabetes   ? Ectopic pregnancy 2016  ? Fatty liver   ? PCOS (polycystic ovarian syndrome)   ? Pre-diabetes   ? ? ?Social History  ? ?Socioeconomic History  ? Marital status: Married  ?  Spouse name: Not on file  ? Number of children: Not on file  ? Years of education: Not on file  ? Highest education level: Not on file  ?Occupational History  ? Not on file  ?Tobacco Use  ? Smoking status: Former  ? Smokeless tobacco: Never  ?Vaping Use  ? Vaping Use: Every day  ? Start date: 06/04/2015  ?Substance and Sexual Activity  ? Alcohol use: Yes  ?  Alcohol/week: 0.0 standard drinks  ?   Comment: soical  ? Drug use: Never  ? Sexual activity: Not on file  ?Other Topics Concern  ? Not on file  ?Social History Narrative  ? Married.  ? No children.  ? Works at [employer].  ? Enjoys playing games, spending time with family.  ? ?Social Determinants of Health  ? ?Financial Resource Strain: Not on file  ?Food Insecurity: Not on file  ?Transportation Needs: Not on file  ?Physical Activity: Not on file  ?Stress: Not on file  ?Social Connections: Not on file  ?Intimate Partner Violence: Not on file  ? ? ?Past Surgical History:  ?Procedure Laterality Date  ? CHOLECYSTECTOMY N/A 06/10/2020  ? Procedure: LAPAROSCOPIC CHOLECYSTECTOMY;  Surgeon: Johnathan Hausen, MD;  Location: [facility];  Service: General;  Laterality: N/A;  ? DILATION AND CURETTAGE OF UTERUS    ? ? ?Family History  ?Problem Relation Age of Onset  ? Diabetes Father   ? Hypertension Father   ? Heart disease Father   ? Melanoma Father   ? Hyperlipidemia Mother   ? Diabetes Brother   ? Hypertension Brother   ? Ovarian cancer Paternal Aunt   ? Esophageal cancer Maternal Grandmother   ? ? ?No Known Allergies ? ?Current Outpatient Medications on File Prior to Visit  ?Medication Sig Dispense Refill  ? hydrOXYzine (ATARAX/VISTARIL) 10 MG tablet Take 1-2 tablets (10-20 mg total) by mouth 2 (two) times daily as needed for anxiety. 60 tablet 0  ?  norethindrone-ethinyl estradiol (JUNEL 1/20) 1-20 MG-MCG tablet Take 1 tablet by mouth daily. 84 tablet 1  ? Semaglutide,0.25 or 0.5MG /DOS, (OZEMPIC, 0.25 OR 0.5 MG/DOSE,) 2 MG/1.5ML SOPN INJECT 0.25 MG INTO THE SKIN ONCE A WEEK. FOR DIABETES. 3 mL 1  ? sertraline (ZOLOFT) 100 MG tablet TAKE 1 TABLET (100 MG TOTAL) BY MOUTH DAILY. FOR ANXIETY AND DEPRESSION. 90 tablet 2  ? ?No current facility-administered medications on file prior to visit.  ? ? ?BP 118/80 (BP Location: Left Arm, Patient Position: Sitting, Cuff Size: Large)   Pulse 82   Temp (!) 97 ?F (36.1 ?C) (Temporal)   Ht 5' 5.75" (1.67 m)   Wt 272 lb  (123.4 kg)   SpO2 98%   BMI 44.24 kg/m?  ?Objective:  ? Physical Exam ?Exam conducted with a chaperone present.  ?HENT:  ?   Right Ear: Tympanic membrane and ear canal normal.  ?   Left Ear: Tympanic membrane and ear canal normal.  ?   Nose: Nose normal.  ?Eyes:  ?   Conjunctiva/sclera: Conjunctivae normal.  ?   Pupils: Pupils are equal, round, and reactive to light.  ?Neck:  ?   Thyroid: No thyromegaly.  ?Cardiovascular:  ?   Rate and Rhythm: Normal rate and regular rhythm.  ?   Heart sounds: No murmur heard. ?Pulmonary:  ?   Effort: Pulmonary effort is normal.  ?   Breath sounds: Normal breath sounds. No rales.  ?Abdominal:  ?   General: Bowel sounds are normal.  ?   Palpations: Abdomen is soft.  ?   Tenderness: There is no abdominal tenderness.  ?Genitourinary: ?   Labia:     ?   Right: No tenderness or lesion.     ?   Left: No tenderness or lesion.   ?   Vagina: No vaginal discharge.  ?   Cervix: Cervical bleeding present. No cervical motion tenderness or lesion.  ?   Uterus: Normal.   ?   Adnexa: Right adnexa normal and left adnexa normal.  ?   Comments: Scant amount of dark red spotting. ?Musculoskeletal:     ?   General: Normal range of motion.  ?   Cervical back: Neck supple.  ?Lymphadenopathy:  ?   Cervical: No cervical adenopathy.  ?Skin: ?   General: Skin is warm and dry.  ?   Findings: No rash.  ?Neurological:  ?   Mental Status: She is alert and oriented to person, place, and time.  ?   Cranial Nerves: No cranial nerve deficit.  ?   Deep Tendon Reflexes: Reflexes are normal and symmetric.  ?Psychiatric:     ?   Mood and Affect: Mood normal.  ? ? ? ? ? ?   ?Assessment & Plan:  ? ? ? ? ?This visit occurred during the SARS-CoV-2 public health emergency.  Safety protocols were in place, including screening questions prior to the visit, additional usage of staff PPE, and extensive cleaning of exam room while observing appropriate contact time as indicated for disinfecting solutions.  ?

## 2021-11-14 LAB — CYTOLOGY - PAP
Comment: NEGATIVE
Diagnosis: NEGATIVE
High risk HPV: NEGATIVE

## 2021-12-13 IMAGING — US US PELVIS COMPLETE WITH TRANSVAGINAL
2 series · 13 of 25 positions shown · non-contrast
Comparison: None

CLINICAL DATA: Abnormal uterine bleeding, pelvic pain



[Series 1: us pelvic complete with transvaginal · 12 of 97 slices shown]
[im 1/97]
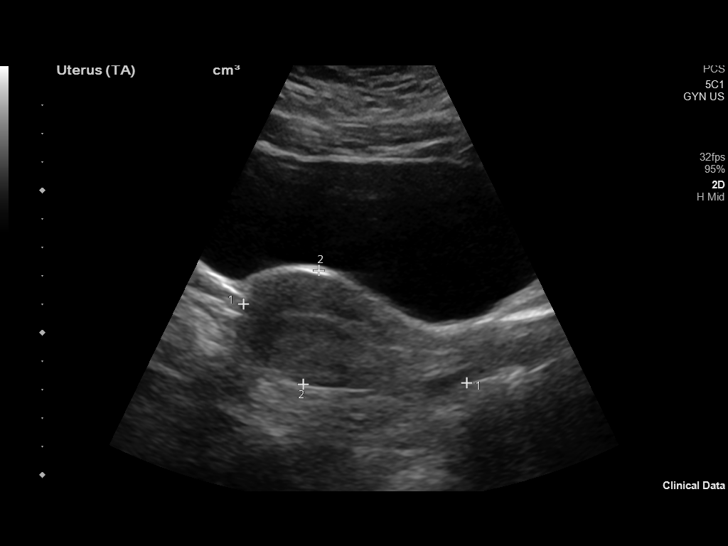
[im 9/97]
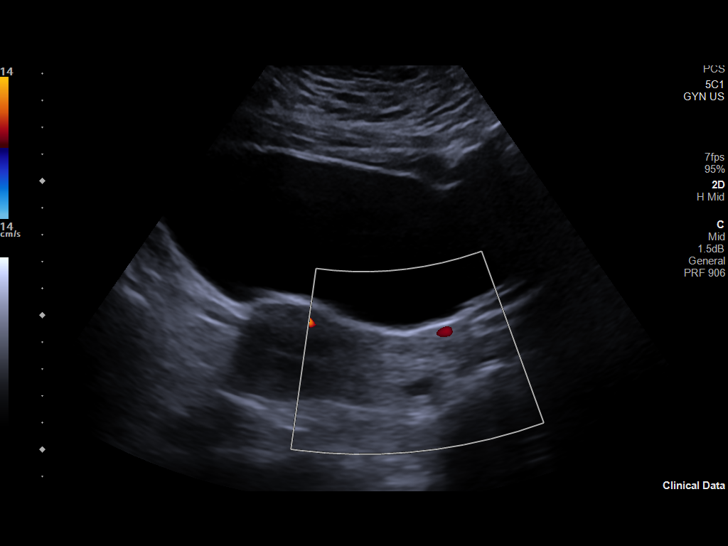
[im 17/97]
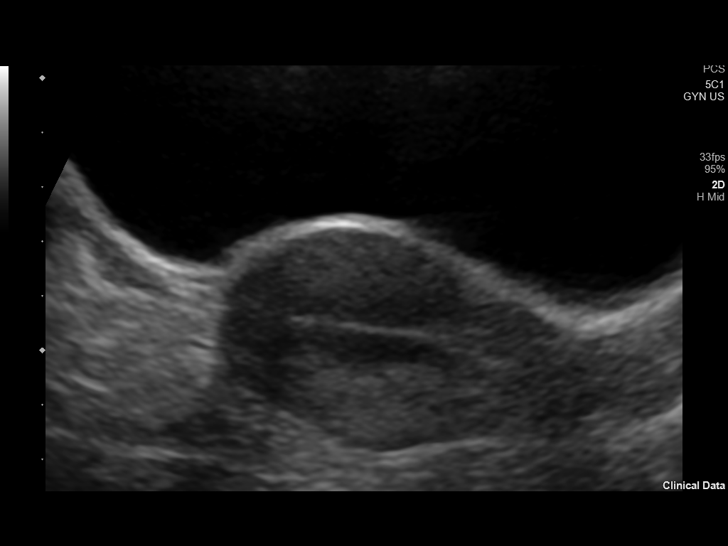
[im 26/97]
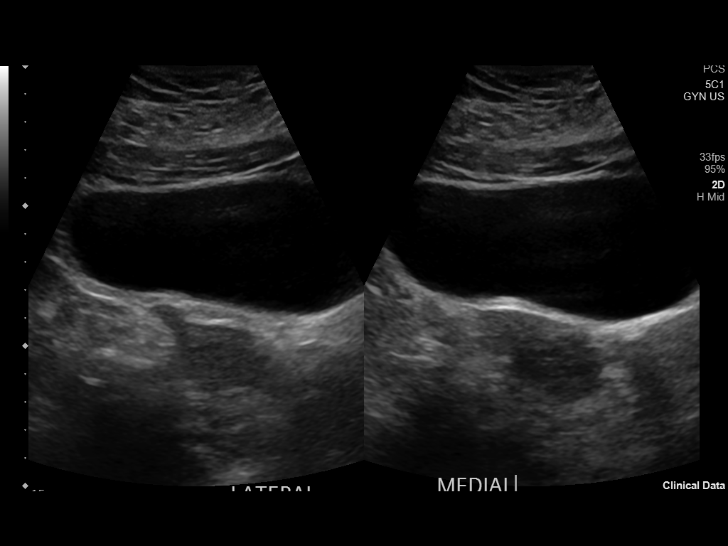
[im 34/97]
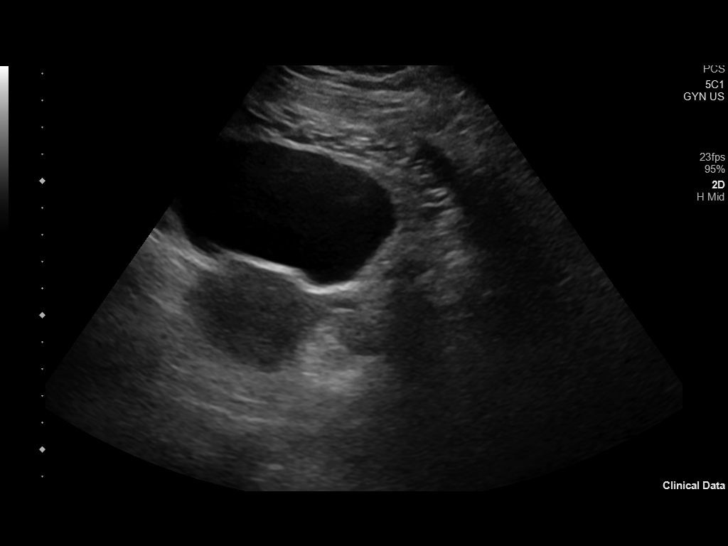
[im 42/97]
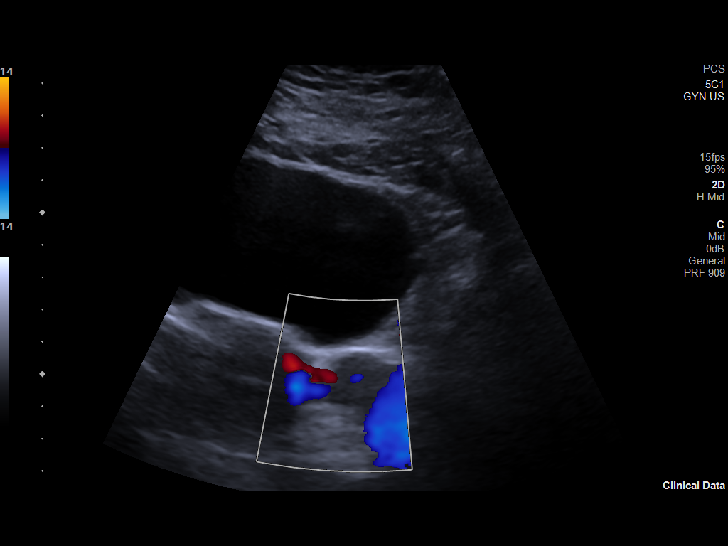
[im 51/97]
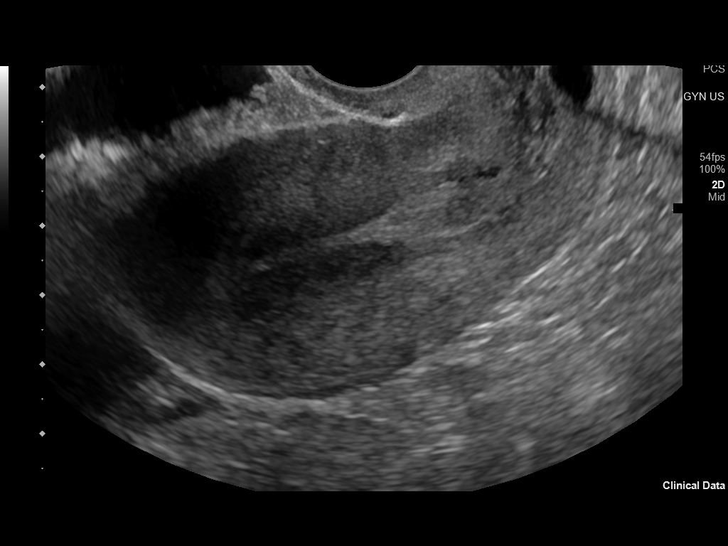
[im 59/97]
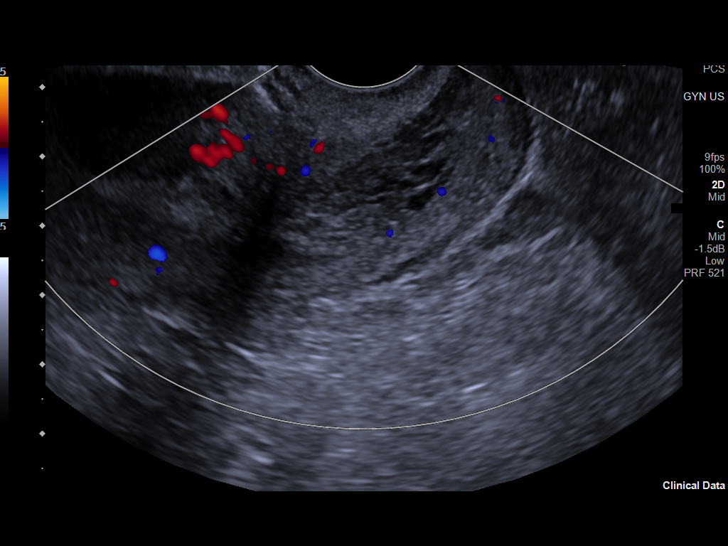
[im 67/97]
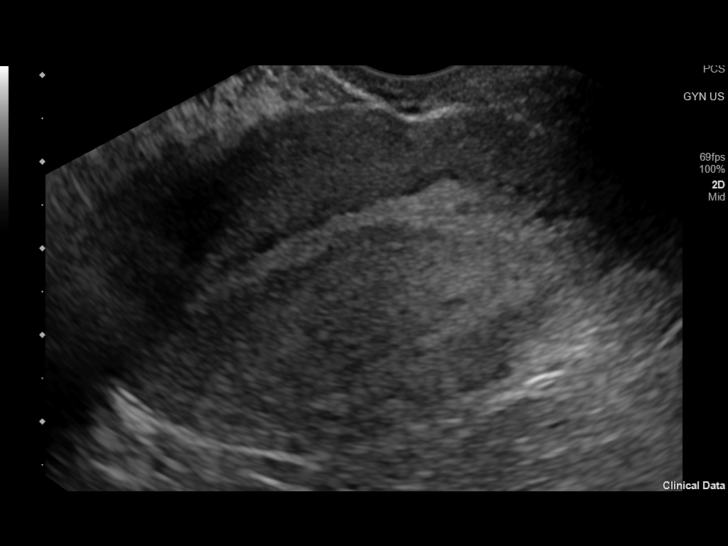
[im 76/97]
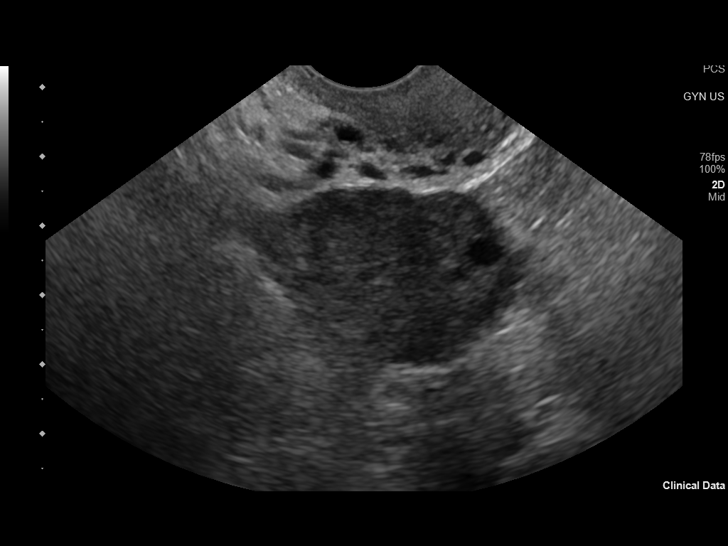
[im 84/97]
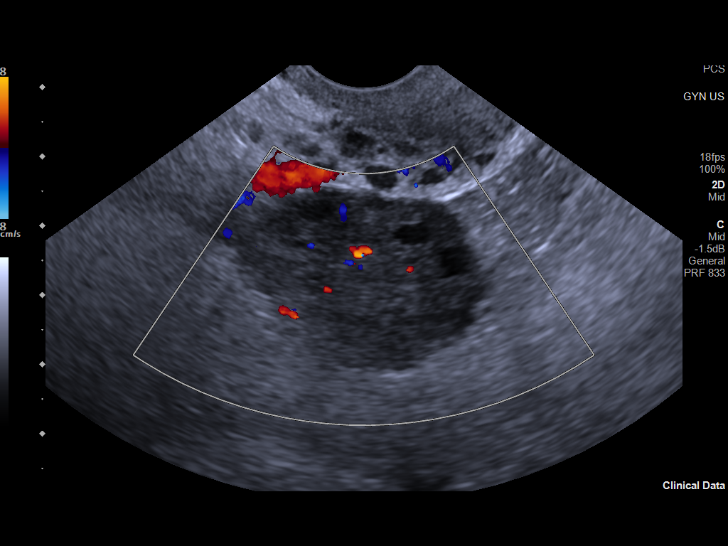
[im 92/97]
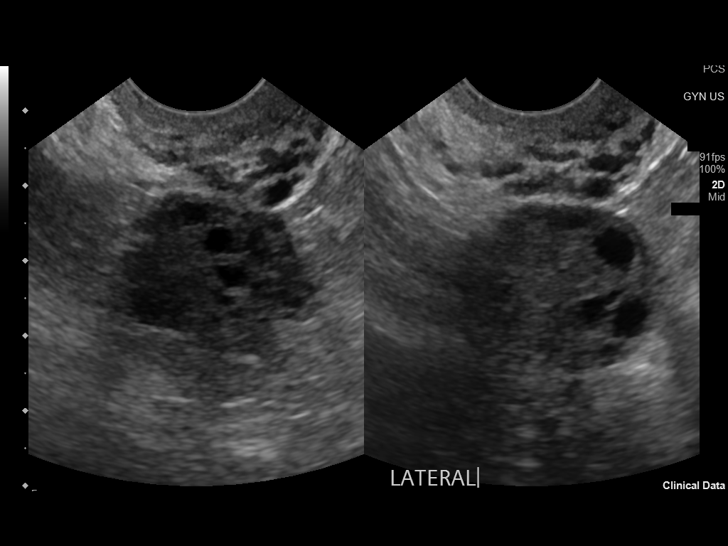

[Series 1001: gyn us · 1 of 2 slices shown]
[im 1/2]
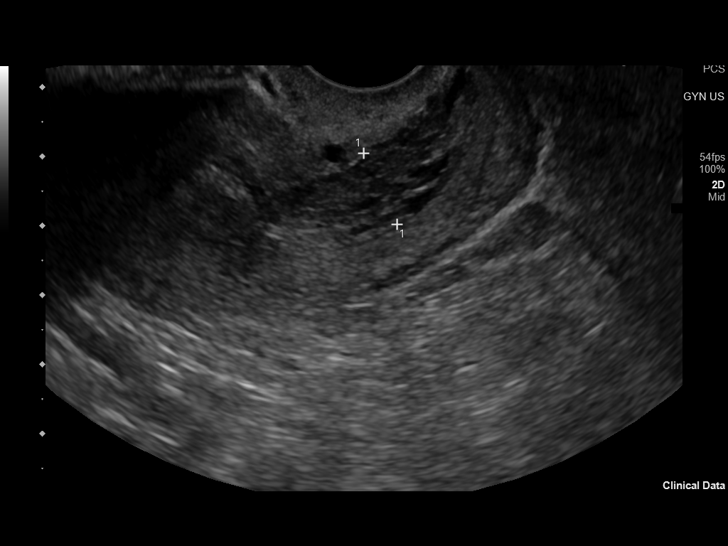

[13 of 25 positions shown; findings below may reference images not displayed]

FINDINGS: Uterus

Measurements: 8.3 x 4.0 x 4.3 cm. = volume: 74 mL. No fibroids or
other mass visualized.

Endometrium

Thickness: Ranging from 7.810.8 mm. The endometrial stripe is more
prominent inferiorly within the uterus and extending towards the
cervix.

Right ovary

Measurements: 3.6 x 2.5 x 2.2 cm. = volume: 10.4 mL. Scattered small
cysts are noted.

Left ovary

Measurements: 3.3 x 2.5 x 2.2 cm = volume: 9.2 mL. Scattered small
cysts are noted.

Other findings

No abnormal free fluid.
IMPRESSION: Prominent endometrium particularly along the lower uterine segment
and cervix. If bleeding remains unresponsive to hormonal or medical
therapy, sonohysterogram should be considered for focal lesion
work-up. (Ref: Radiological Reasoning: Algorithmic Workup of
Abnormal Vaginal Bleeding with Endovaginal Sonography and
Sonohysterography. AJR [id number]; [id number])

Scattered peripheral cysts within the ovaries bilaterally. These
changes may represent PCOS.

## 2022-01-27 ENCOUNTER — Other Ambulatory Visit: Payer: Self-pay | Admitting: Primary Care

## 2022-01-27 DIAGNOSIS — E282 Polycystic ovarian syndrome: Secondary | ICD-10-CM

## 2022-01-27 DIAGNOSIS — N939 Abnormal uterine and vaginal bleeding, unspecified: Secondary | ICD-10-CM

## 2022-03-21 ENCOUNTER — Encounter (INDEPENDENT_AMBULATORY_CARE_PROVIDER_SITE_OTHER): Payer: 59

## 2022-03-21 DIAGNOSIS — E119 Type 2 diabetes mellitus without complications: Secondary | ICD-10-CM

## 2022-03-21 MED ORDER — OZEMPIC (0.25 OR 0.5 MG/DOSE) 2 MG/1.5ML ~~LOC~~ SOPN: 0.5000 mg | PEN_INJECTOR | SUBCUTANEOUS | 0 refills | Status: DC

## 2022-03-21 NOTE — Telephone Encounter (Signed)
Please see the MyChart message reply(ies) for my assessment and plan.  The patient gave consent for this Medical Advice Message and is aware that it may result in a bill to their insurance company as well as the possibility that this may result in a co-payment or deductible. They are an established patient, but are not seeking medical advice exclusively about a problem treated during an in person or video visit in the last 7 days. I did not recommend an in person or video visit within 7 days of my reply.  I spent a total of 10 minutes cumulative time within 7 days through MyChart messaging Ercole Georg K Jonny Longino, NP  

## 2022-04-23 ENCOUNTER — Telehealth: Payer: Self-pay | Admitting: Primary Care

## 2022-04-23 DIAGNOSIS — E119 Type 2 diabetes mellitus without complications: Secondary | ICD-10-CM

## 2022-04-24 NOTE — Telephone Encounter (Signed)
Called and spoke with pharmacy staff. They gave her a one month supply 03/21/2022, possibly due to a shortage at the time. She states she still has two 1 month supply refills left on it.

## 2022-04-24 NOTE — Telephone Encounter (Signed)
Noted, will you notify patient?

## 2022-04-24 NOTE — Telephone Encounter (Signed)
Patient notified. Will go pick up refill from pharmacy.

## 2022-04-24 NOTE — Telephone Encounter (Signed)
Please call pharmacy to find out what's going on with her Ozempic Rx. 90 day supply sent on 03/21/22 yet she's stating she's out?

## 2022-05-09 ENCOUNTER — Other Ambulatory Visit: Payer: Self-pay | Admitting: Primary Care

## 2022-05-09 DIAGNOSIS — F419 Anxiety disorder, unspecified: Secondary | ICD-10-CM

## 2022-05-11 ENCOUNTER — Encounter: Payer: Self-pay | Admitting: Primary Care

## 2022-05-11 ENCOUNTER — Ambulatory Visit: Payer: 59 | Admitting: Primary Care

## 2022-05-11 VITALS — BP 122/78 | HR 80 | Temp 97.9°F | Ht 67.5 in | Wt 269.0 lb

## 2022-05-11 DIAGNOSIS — E1165 Type 2 diabetes mellitus with hyperglycemia: Secondary | ICD-10-CM

## 2022-05-11 DIAGNOSIS — L309 Dermatitis, unspecified: Secondary | ICD-10-CM

## 2022-05-11 LAB — POCT GLYCOSYLATED HEMOGLOBIN (HGB A1C): Hemoglobin A1C: 5.7 % — AB (ref 4.0–5.6)

## 2022-05-11 MED ORDER — TRIAMCINOLONE ACETONIDE 0.5 % EX OINT: 1.0000 | TOPICAL_OINTMENT | Freq: Two times a day (BID) | CUTANEOUS | 0 refills | Status: DC

## 2022-05-11 NOTE — Progress Notes (Signed)
Subjective:    Patient ID: Emily Banks, female    DOB: 1981/07/02, 41 y.o.   MRN: [id number]  HPI  [name] is a very pleasant 41 y.o. female with a history of type 2 diabetes, obesity, fatty liver who presents today for follow up of diabetes and to discuss dermatitis.  1) Type 2 Diabetes:  Current medications include: Ozempic 0.5 mg weekly, metformin XR 500 mg daily.   She is checking her blood glucose on occasion and doesn't recall the readings.   Last A1C: 6.3 in April 2023, 5.7 today Last Eye Exam: Due Last Foot Exam: Due Pneumonia Vaccination: 2023 Urine Microalbumin: UTD Statin: None  Dietary changes since last visit: Increased water intake, less portion size.    Exercise: Walking  2) Dermatitis: Chronic and intermittent for the last 8 years. Typically, more flares in the warmer/Summer months. She will break out with redness and sores to her lower extremities distally to her knees. She's applied neosporin and OTC hydrocortisone with minimal improvement.    BP Readings from Last 3 Encounters:  05/11/22 122/78  11/09/21 118/80  07/05/21 124/76   Wt Readings from Last 3 Encounters:  05/11/22 269 lb (122 kg)  11/09/21 272 lb (123.4 kg)  07/05/21 273 lb (123.8 kg)       Review of Systems  Respiratory:  Negative for shortness of breath.   Cardiovascular:  Negative for chest pain.  Gastrointestinal:  Negative for abdominal pain, constipation and nausea.  Skin:  Positive for rash.  Neurological:  Negative for numbness.         Past Medical History:  Diagnosis Date   Anxiety and depression    Borderline diabetes    Ectopic pregnancy 2016   Fatty liver    PCOS (polycystic ovarian syndrome)    Pre-diabetes     Social History   Socioeconomic History   Marital status: Married    Spouse name: Not on file   Number of children: Not on file   Years of education: Not on file   Highest education level: Not on file  Occupational History    Not on file  Tobacco Use   Smoking status: Former   Smokeless tobacco: Never  Vaping Use   Vaping Use: Every day   Start date: 06/04/2015  Substance and Sexual Activity   Alcohol use: Yes    Alcohol/week: 0.0 standard drinks of alcohol    Comment: soical   Drug use: Never   Sexual activity: Not on file  Other Topics Concern   Not on file  Social History Narrative   Married.   No children.   Works at [employer].   Enjoys playing games, spending time with family.   Social Determinants of Health   Financial Resource Strain: Not on file  Food Insecurity: Not on file  Transportation Needs: Not on file  Physical Activity: Not on file  Stress: Not on file  Social Connections: Not on file  Intimate Partner Violence: Not on file    Past Surgical History:  Procedure Laterality Date   CHOLECYSTECTOMY N/A 06/10/2020   Procedure: LAPAROSCOPIC CHOLECYSTECTOMY;  Surgeon: Luretha Murphy, MD;  Location: [facility];  Service: General;  Laterality: N/A;   DILATION AND CURETTAGE OF UTERUS      Family History  Problem Relation Age of Onset   Diabetes Father    Hypertension Father    Heart disease Father    Melanoma Father    Hyperlipidemia Mother    Diabetes Brother  Hypertension Brother    Ovarian cancer Paternal Aunt    Esophageal cancer Maternal Grandmother     No Known Allergies  Current Outpatient Medications on File Prior to Visit  Medication Sig Dispense Refill   Cholecalciferol (VITAMIN D3) 1.25 MG (50000 UT) CAPS Take 1 capsule by mouth once a week.     hydrOXYzine (ATARAX/VISTARIL) 10 MG tablet Take 1-2 tablets (10-20 mg total) by mouth 2 (two) times daily as needed for anxiety. 60 tablet 0   levothyroxine (SYNTHROID) 25 MCG tablet Take 25 mcg by mouth daily.     metFORMIN (GLUCOPHAGE-XR) 500 MG 24 hr tablet TAKE 1 TABLET BY MOUTH EVERY DAY WITH BREAKFAST     Semaglutide,0.25 or 0.5MG /DOS, (OZEMPIC, 0.25 OR 0.5 MG/DOSE,) 2 MG/1.5ML SOPN Inject 0.5 mg into the skin  once a week. For diabetes. 4.5 mL 0   sertraline (ZOLOFT) 100 MG tablet TAKE 1 TABLET (100 MG TOTAL) BY MOUTH DAILY. FOR ANXIETY AND DEPRESSION. 90 tablet 1   No current facility-administered medications on file prior to visit.    BP 122/78   Pulse 80   Temp 97.9 F (36.6 C) (Temporal)   Ht 5' 7.5" (1.715 m)   Wt 269 lb (122 kg)   SpO2 97%   BMI 41.51 kg/m  Objective:   Physical Exam Cardiovascular:     Rate and Rhythm: Normal rate and regular rhythm.  Pulmonary:     Effort: Pulmonary effort is normal.     Breath sounds: Normal breath sounds.  Musculoskeletal:     Cervical back: Neck supple.  Skin:    General: Skin is warm and dry.     Findings: Erythema and rash present.     Comments: Moderate erythema with several skin sores to bilateral lower extremities between distal knees to proximal ankles. No warmth or obvious infection            Assessment & Plan:   Problem List Items Addressed This Visit       Endocrine   Type 2 diabetes mellitus with hyperglycemia ([place]) - Primary    Controlled with A1C of 5.7 today!  Continue metformin XR 500 mg daily. Continue Ozempic 0.5 mg weekly.  Foot exam today.  Follow up in 6 months.      Relevant Medications   metFORMIN (GLUCOPHAGE-XR) 500 MG 24 hr tablet   Other Relevant Orders   POCT glycosylated hemoglobin (Hb A1C) (Completed)     Musculoskeletal and Integument   Dermatitis    Chronic, moderate flare now. Offered IM Depo Medrol injection today, she declines.  Start triamcinolone 0.5% ointment BID PRN. She will also establish with dermatology       Relevant Medications   triamcinolone ointment (KENALOG) 0.5 %       Pleas Koch, NP

## 2022-05-11 NOTE — Assessment & Plan Note (Signed)
Chronic, moderate flare now. Offered IM Depo Medrol injection today, she declines.  Start triamcinolone 0.5% ointment BID PRN. She will also establish with dermatology

## 2022-05-11 NOTE — Patient Instructions (Signed)
You may apply the ointment twice daily as needed to your legs.  Continue Ozempic and metformin.  Please schedule a physical to meet with me in 6 months.   It was a pleasure to see you today!

## 2022-05-11 NOTE — Assessment & Plan Note (Signed)
Controlled with A1C of 5.7 today!  Continue metformin XR 500 mg daily. Continue Ozempic 0.5 mg weekly.  Foot exam today.  Follow up in 6 months.

## 2022-06-19 ENCOUNTER — Other Ambulatory Visit: Payer: Self-pay | Admitting: Primary Care

## 2022-06-19 DIAGNOSIS — E119 Type 2 diabetes mellitus without complications: Secondary | ICD-10-CM

## 2022-06-29 ENCOUNTER — Telehealth: Payer: Self-pay | Admitting: Primary Care

## 2022-06-29 NOTE — Telephone Encounter (Signed)
Please call patient:  Received refill request for levothyroxine, however, I've not prescribed this. Where did it come from? Needs to be sent to ordering provider.

## 2022-06-29 NOTE — Telephone Encounter (Signed)
From: Roxy Horseman To: Office of Doreene Nest, NP Sent: 06/28/2022 9:56 AM EST Subject: Medication Renewal Request  Refills have been requested for the following medications:   levothyroxine (SYNTHROID) 25 MCG tablet  Preferred pharmacy: [facility] Delivery method: Daryll Drown

## 2022-06-29 NOTE — Telephone Encounter (Signed)
Unable to reach patient. Left voicemail to return call to our office.   

## 2022-07-01 ENCOUNTER — Other Ambulatory Visit: Payer: Self-pay | Admitting: Primary Care

## 2022-07-01 NOTE — Telephone Encounter (Signed)
From: Roxy Horseman To: Office of Doreene Nest, NP Sent: 06/30/2022 11:31 AM EST Subject: Medication Renewal Request  Refills have been requested for the following medications:   levothyroxine (SYNTHROID) 25 MCG tablet  Preferred pharmacy: [facility] Delivery method: Mail

## 2022-07-02 NOTE — Telephone Encounter (Signed)
Called and spoke with patient, she states her infertility specialist is the one who prescribes this for her. I advised to reach out to their office for refills since they are managing this medication for her.

## 2022-08-14 DIAGNOSIS — Z3183 Encounter for assisted reproductive fertility procedure cycle: Secondary | ICD-10-CM | POA: Diagnosis not present

## 2022-08-14 DIAGNOSIS — Z319 Encounter for procreative management, unspecified: Secondary | ICD-10-CM | POA: Diagnosis not present

## 2022-08-14 DIAGNOSIS — E282 Polycystic ovarian syndrome: Secondary | ICD-10-CM | POA: Diagnosis not present

## 2022-08-14 DIAGNOSIS — Z3141 Encounter for fertility testing: Secondary | ICD-10-CM | POA: Diagnosis not present

## 2022-08-15 ENCOUNTER — Other Ambulatory Visit (HOSPITAL_COMMUNITY): Payer: Self-pay

## 2022-08-17 ENCOUNTER — Telehealth: Payer: Self-pay

## 2022-08-17 DIAGNOSIS — Z319 Encounter for procreative management, unspecified: Secondary | ICD-10-CM | POA: Diagnosis not present

## 2022-08-20 NOTE — Telephone Encounter (Signed)
Tried to start prior auth for Ozempic. Patient will not pull up under cover my meds sending message to Patient in my chart to see if she can send picture of insurance card so that we can verify we have right one in system.

## 2022-08-22 ENCOUNTER — Telehealth: Payer: Self-pay | Admitting: Primary Care

## 2022-08-22 DIAGNOSIS — E119 Type 2 diabetes mellitus without complications: Secondary | ICD-10-CM

## 2022-08-22 MED ORDER — OZEMPIC (0.25 OR 0.5 MG/DOSE) 2 MG/3ML ~~LOC~~ SOPN: PEN_INJECTOR | SUBCUTANEOUS | 0 refills | Status: DC

## 2022-08-22 NOTE — Telephone Encounter (Signed)
Prescription Request  08/22/2022  Is this a "Controlled Substance" medicine? No  LOV: 05/11/2022  What is the name of the medication or equipment?   Semaglutide,0.25 or 0.5MG /DOS, (OZEMPIC, 0.25 OR 0.5 MG/DOSE,) 2 MG/3ML SOPN  Have you contacted your pharmacy to request a refill? No   Which pharmacy would you like this sent to?  [id number] - [place], [place] - [address] AT [address] [place] [address] Phone: [phone] Fax: [phone]    Patient notified that their request is being sent to the clinical staff for review and that they should receive a response within 2 business days.   Please advise at Mobile [phone] (mobile)

## 2022-08-22 NOTE — Telephone Encounter (Signed)
Noted. Requested Prescriptions   Signed Prescriptions Disp Refills   Semaglutide,0.25 or 0.5MG /DOS, (OZEMPIC, 0.25 OR 0.5 MG/DOSE,) 2 MG/3ML SOPN 9 mL 0    Sig: INJECT 0.5 MG INTO THE SKIN ONCE A WEEK. FOR DIABETES.    Authorizing Provider: Pleas Koch   Refill(s) sent to pharmacy.

## 2022-08-22 NOTE — Telephone Encounter (Signed)
Called and spoke to patient, she states her insurance changed and she's having to use [facility] now for her pharmacy. She is completely out of the ozempic 0.5mg  she used her last pen Monday 08/20/22.

## 2022-08-23 NOTE — Telephone Encounter (Signed)
Called insurance yesterday was on phone over 45 minutes. Was not able to get an outcome will try to start new request on cover my meds today.

## 2022-09-07 DIAGNOSIS — Z3183 Encounter for assisted reproductive fertility procedure cycle: Secondary | ICD-10-CM | POA: Diagnosis not present

## 2022-09-11 DIAGNOSIS — N979 Female infertility, unspecified: Secondary | ICD-10-CM | POA: Diagnosis not present

## 2022-09-11 DIAGNOSIS — Z3183 Encounter for assisted reproductive fertility procedure cycle: Secondary | ICD-10-CM | POA: Diagnosis not present

## 2022-09-13 DIAGNOSIS — Z3183 Encounter for assisted reproductive fertility procedure cycle: Secondary | ICD-10-CM | POA: Diagnosis not present

## 2022-09-13 DIAGNOSIS — Z319 Encounter for procreative management, unspecified: Secondary | ICD-10-CM | POA: Diagnosis not present

## 2022-09-13 DIAGNOSIS — N979 Female infertility, unspecified: Secondary | ICD-10-CM | POA: Diagnosis not present

## 2022-09-15 DIAGNOSIS — E282 Polycystic ovarian syndrome: Secondary | ICD-10-CM | POA: Diagnosis not present

## 2022-09-15 DIAGNOSIS — N979 Female infertility, unspecified: Secondary | ICD-10-CM | POA: Diagnosis not present

## 2022-09-15 DIAGNOSIS — Z3183 Encounter for assisted reproductive fertility procedure cycle: Secondary | ICD-10-CM | POA: Diagnosis not present

## 2022-09-17 DIAGNOSIS — Z3183 Encounter for assisted reproductive fertility procedure cycle: Secondary | ICD-10-CM | POA: Diagnosis not present

## 2022-09-17 DIAGNOSIS — N979 Female infertility, unspecified: Secondary | ICD-10-CM | POA: Diagnosis not present

## 2022-09-19 DIAGNOSIS — N979 Female infertility, unspecified: Secondary | ICD-10-CM | POA: Diagnosis not present

## 2022-09-19 DIAGNOSIS — Z3183 Encounter for assisted reproductive fertility procedure cycle: Secondary | ICD-10-CM | POA: Diagnosis not present

## 2022-09-19 DIAGNOSIS — Z3141 Encounter for fertility testing: Secondary | ICD-10-CM | POA: Diagnosis not present

## 2022-09-24 DIAGNOSIS — N979 Female infertility, unspecified: Secondary | ICD-10-CM | POA: Diagnosis not present

## 2022-09-24 DIAGNOSIS — Z3183 Encounter for assisted reproductive fertility procedure cycle: Secondary | ICD-10-CM | POA: Diagnosis not present

## 2022-09-25 DIAGNOSIS — N979 Female infertility, unspecified: Secondary | ICD-10-CM | POA: Diagnosis not present

## 2022-09-25 DIAGNOSIS — Z3183 Encounter for assisted reproductive fertility procedure cycle: Secondary | ICD-10-CM | POA: Diagnosis not present

## 2022-11-13 ENCOUNTER — Ambulatory Visit (INDEPENDENT_AMBULATORY_CARE_PROVIDER_SITE_OTHER): Payer: BC Managed Care – PPO | Admitting: Primary Care

## 2022-11-13 ENCOUNTER — Encounter: Payer: Self-pay | Admitting: Primary Care

## 2022-11-13 VITALS — BP 118/76 | HR 75 | Temp 98.1°F | Ht 67.5 in | Wt 257.0 lb

## 2022-11-13 DIAGNOSIS — E1165 Type 2 diabetes mellitus with hyperglycemia: Secondary | ICD-10-CM | POA: Diagnosis not present

## 2022-11-13 DIAGNOSIS — F419 Anxiety disorder, unspecified: Secondary | ICD-10-CM | POA: Diagnosis not present

## 2022-11-13 DIAGNOSIS — Z1231 Encounter for screening mammogram for malignant neoplasm of breast: Secondary | ICD-10-CM | POA: Diagnosis not present

## 2022-11-13 DIAGNOSIS — E282 Polycystic ovarian syndrome: Secondary | ICD-10-CM

## 2022-11-13 DIAGNOSIS — Z0000 Encounter for general adult medical examination without abnormal findings: Secondary | ICD-10-CM | POA: Diagnosis not present

## 2022-11-13 DIAGNOSIS — F32A Depression, unspecified: Secondary | ICD-10-CM

## 2022-11-13 LAB — HEMOGLOBIN A1C: Hgb A1c MFr Bld: 5.5 % (ref 4.6–6.5)

## 2022-11-13 LAB — LIPID PANEL
Cholesterol: 189 mg/dL (ref 0–200)
HDL: 47.9 mg/dL (ref >39.00)
LDL Cholesterol: 105 mg/dL — ABNORMAL HIGH (ref 0–99)
NonHDL: 141.44
Total CHOL/HDL Ratio: 4
Triglycerides: 184 mg/dL — ABNORMAL HIGH (ref 0.0–149.0)
VLDL: 36.8 mg/dL (ref 0.0–40.0)

## 2022-11-13 LAB — COMPREHENSIVE METABOLIC PANEL
ALT: 28 U/L (ref 0–35)
AST: 27 U/L (ref 0–37)
Albumin: 4.5 g/dL (ref 3.5–5.2)
Alkaline Phosphatase: 70 U/L (ref 39–117)
BUN: 11 mg/dL (ref 6–23)
CO2: 25 mEq/L (ref 19–32)
Calcium: 9.7 mg/dL (ref 8.4–10.5)
Chloride: 103 mEq/L (ref 96–112)
Creatinine, Ser: 1.02 mg/dL (ref 0.40–1.20)
GFR: 68.06 mL/min (ref >60.00)
Glucose, Bld: 81 mg/dL (ref 70–99)
Potassium: 4.3 mEq/L (ref 3.5–5.1)
Sodium: 137 mEq/L (ref 135–145)
Total Bilirubin: 0.5 mg/dL (ref 0.2–1.2)
Total Protein: 7.1 g/dL (ref 6.0–8.3)

## 2022-11-13 NOTE — Patient Instructions (Addendum)
Stop by the lab prior to leaving today. I will notify you of your results once received.   [facility].   Please schedule a follow up visit for 6 months for a diabetes check.  It was a pleasure to see you today!

## 2022-11-13 NOTE — Assessment & Plan Note (Addendum)
Repeat A1C pending.  Continue metformin XR 500 mg twice daily and Ozempic 0.5 mg weekly.  Commended her on weight loss.

## 2022-11-13 NOTE — Progress Notes (Signed)
Subjective:    Patient ID: Emily Banks, female    DOB: 1981-06-23, 42 y.o.   MRN: [id number]  HPI  Emily Banks is a very pleasant 42 y.o. female who presents today for complete physical and follow up of chronic conditions.  Immunizations: -Tetanus: Completed in 2020  Diet: Fair diet. Reduced potion sizes  Exercise: Walking   Eye exam: Completes annually  Dental exam: Completes semi-annually    Pap Smear: 2023 Mammogram: Never completed   BP Readings from Last 3 Encounters:  11/13/22 118/76  05/11/22 122/78  11/09/21 118/80   Wt Readings from Last 3 Encounters:  11/13/22 257 lb (116.6 kg)  05/11/22 269 lb (122 kg)  11/09/21 272 lb (123.4 kg)      Review of Systems  Constitutional:  Negative for unexpected weight change.  HENT:  Negative for rhinorrhea.   Respiratory:  Negative for cough and shortness of breath.   Cardiovascular:  Negative for chest pain.  Gastrointestinal:  Negative for constipation and diarrhea.  Genitourinary:  Negative for difficulty urinating.  Musculoskeletal:  Negative for arthralgias and myalgias.  Skin:  Negative for rash.  Allergic/Immunologic: Negative for environmental allergies.  Neurological:  Positive for headaches. Negative for dizziness and numbness.  Psychiatric/Behavioral:  The patient is not nervous/anxious.          Past Medical History:  Diagnosis Date   Abnormal uterine bleeding 10/13/2020   Anxiety and depression    Biliary colic 04/10/2019   Abd [place] 03/2019 - cholelithiasis, fatty liver   Borderline diabetes    Ectopic pregnancy 2016   Fatty liver    PCOS (polycystic ovarian syndrome)    Pelvic pain 10/13/2020   Pre-diabetes     Social History   Socioeconomic History   Marital status: Married    Spouse name: Not on file   Number of children: Not on file   Years of education: Not on file   Highest education level: Not on file  Occupational History   Not on file  Tobacco Use   Smoking  status: Former   Smokeless tobacco: Never  Vaping Use   Vaping Use: Every day   Start date: 06/04/2015  Substance and Sexual Activity   Alcohol use: Yes    Alcohol/week: 0.0 standard drinks of alcohol    Comment: soical   Drug use: Never   Sexual activity: Not on file  Other Topics Concern   Not on file  Social History Narrative   Married.   No children.   Works at [employer].   Enjoys playing games, spending time with family.   Social Determinants of Health   Financial Resource Strain: Not on file  Food Insecurity: Not on file  Transportation Needs: Not on file  Physical Activity: Not on file  Stress: Not on file  Social Connections: Not on file  Intimate Partner Violence: Not on file    Past Surgical History:  Procedure Laterality Date   CHOLECYSTECTOMY N/A 06/10/2020   Procedure: LAPAROSCOPIC CHOLECYSTECTOMY;  Surgeon: Luretha Murphy, MD;  Location: [facility];  Service: General;  Laterality: N/A;   DILATION AND CURETTAGE OF UTERUS      Family History  Problem Relation Age of Onset   Diabetes Father    Hypertension Father    Heart disease Father    Melanoma Father    Hyperlipidemia Mother    Diabetes Brother    Hypertension Brother    Ovarian cancer Paternal Aunt    Esophageal cancer Maternal Grandmother  No Known Allergies  Current Outpatient Medications on File Prior to Visit  Medication Sig Dispense Refill   hydrOXYzine (ATARAX/VISTARIL) 10 MG tablet Take 1-2 tablets (10-20 mg total) by mouth 2 (two) times daily as needed for anxiety. 60 tablet 0   levothyroxine (SYNTHROID) 25 MCG tablet Take 25 mcg by mouth daily.     metFORMIN (GLUCOPHAGE-XR) 500 MG 24 hr tablet TAKE 1 TABLET BY MOUTH EVERY DAY WITH BREAKFAST     Semaglutide,0.25 or 0.5MG /DOS, (OZEMPIC, 0.25 OR 0.5 MG/DOSE,) 2 MG/3ML SOPN INJECT 0.5 MG INTO THE SKIN ONCE A WEEK. FOR DIABETES. 9 mL 0   sertraline (ZOLOFT) 100 MG tablet TAKE 1 TABLET (100 MG TOTAL) BY MOUTH DAILY. FOR ANXIETY AND  DEPRESSION. 90 tablet 1   triamcinolone ointment (KENALOG) 0.5 % Apply 1 Application topically 2 (two) times daily. 30 g 0   Cholecalciferol (VITAMIN D3) 1.25 MG (50000 UT) CAPS Take 1 capsule by mouth once a week.     No current facility-administered medications on file prior to visit.    BP 118/76   Pulse 75   Temp 98.1 F (36.7 C) (Temporal)   Ht 5' 7.5" (1.715 m)   Wt 257 lb (116.6 kg)   LMP 11/02/2022 (Exact Date)   SpO2 98%   BMI 39.66 kg/m  Objective:   Physical Exam HENT:     Right Ear: Tympanic membrane and ear canal normal.     Left Ear: Tympanic membrane and ear canal normal.     Nose: Nose normal.  Eyes:     Conjunctiva/sclera: Conjunctivae normal.     Pupils: Pupils are equal, round, and reactive to light.  Neck:     Thyroid: No thyromegaly.  Cardiovascular:     Rate and Rhythm: Normal rate and regular rhythm.     Heart sounds: No murmur heard. Pulmonary:     Effort: Pulmonary effort is normal.     Breath sounds: Normal breath sounds. No rales.  Abdominal:     General: Bowel sounds are normal.     Palpations: Abdomen is soft.     Tenderness: There is no abdominal tenderness.  Musculoskeletal:        General: Normal range of motion.     Cervical back: Neck supple.  Lymphadenopathy:     Cervical: No cervical adenopathy.  Skin:    General: Skin is warm and dry.     Findings: No rash.  Neurological:     Mental Status: She is alert and oriented to person, place, and time.     Cranial Nerves: No cranial nerve deficit.     Deep Tendon Reflexes: Reflexes are normal and symmetric.  Psychiatric:        Mood and Affect: Mood normal.           Assessment & Plan:  Preventative health care Assessment & Plan: Immunizations UTD. Pap smear UTD. Mammogram overdue, orders placed.   Discussed the importance of a healthy diet and regular exercise in order for weight loss, and to reduce the risk of further co-morbidity.  Exam stable. Labs pending.  Follow  up in 1 year for repeat physical.    Type 2 diabetes mellitus with hyperglycemia, without long-term current use of insulin Assessment & Plan: Repeat A1C pending.  Continue metformin XR 500 mg twice daily and Ozempic 0.5 mg weekly.  Commended her on weight loss.   Orders: -     Lipid panel -     Comprehensive metabolic panel -  Hemoglobin A1c  Anxiety and depression Assessment & Plan: Controlled.  Continue sertraline 100 mg daily.    Screening mammogram for breast cancer -     3D Screening Mammogram, Left and Right; Future  PCOS (polycystic ovarian syndrome) Assessment & Plan: Following with [facility]   Continue metformin XR 500 mg BID.         Doreene Nest, NP

## 2022-11-13 NOTE — Assessment & Plan Note (Signed)
Controlled.  Continue sertraline 100 mg daily.   

## 2022-11-13 NOTE — Assessment & Plan Note (Signed)
Following with [facility]   Continue metformin XR 500 mg BID.

## 2022-11-13 NOTE — Assessment & Plan Note (Signed)
Immunizations UTD. Pap smear UTD. Mammogram overdue, orders placed.   Discussed the importance of a healthy diet and regular exercise in order for weight loss, and to reduce the risk of further co-morbidity.  Exam stable. Labs pending.  Follow up in 1 year for repeat physical.

## 2022-11-15 DIAGNOSIS — F419 Anxiety disorder, unspecified: Secondary | ICD-10-CM

## 2022-11-15 MED ORDER — SERTRALINE HCL 100 MG PO TABS: 100.0000 mg | ORAL_TABLET | Freq: Every day | ORAL | 3 refills | Status: DC

## 2022-11-16 DIAGNOSIS — N979 Female infertility, unspecified: Secondary | ICD-10-CM | POA: Diagnosis not present

## 2022-11-16 DIAGNOSIS — Z319 Encounter for procreative management, unspecified: Secondary | ICD-10-CM | POA: Diagnosis not present

## 2022-11-19 DIAGNOSIS — N979 Female infertility, unspecified: Secondary | ICD-10-CM | POA: Diagnosis not present

## 2022-11-19 DIAGNOSIS — Z3183 Encounter for assisted reproductive fertility procedure cycle: Secondary | ICD-10-CM | POA: Diagnosis not present

## 2022-11-19 DIAGNOSIS — E559 Vitamin D deficiency, unspecified: Secondary | ICD-10-CM | POA: Diagnosis not present

## 2022-11-19 DIAGNOSIS — Z319 Encounter for procreative management, unspecified: Secondary | ICD-10-CM | POA: Diagnosis not present

## 2022-11-19 DIAGNOSIS — E282 Polycystic ovarian syndrome: Secondary | ICD-10-CM | POA: Diagnosis not present

## 2022-11-20 ENCOUNTER — Ambulatory Visit: Payer: BC Managed Care – PPO | Admitting: Obstetrics & Gynecology

## 2022-11-20 ENCOUNTER — Encounter: Payer: Self-pay | Admitting: Obstetrics & Gynecology

## 2022-11-20 VITALS — BP 110/72 | Wt 255.0 lb

## 2022-11-20 DIAGNOSIS — N970 Female infertility associated with anovulation: Secondary | ICD-10-CM

## 2022-11-20 DIAGNOSIS — Z7689 Persons encountering health services in other specified circumstances: Secondary | ICD-10-CM

## 2022-11-20 DIAGNOSIS — E282 Polycystic ovarian syndrome: Secondary | ICD-10-CM

## 2022-11-20 NOTE — Progress Notes (Signed)
GYNECOLOGY OFFICE VISIT NOTE  History:   Emily Banks is a 42 y.o. G1P0010 here today to establish care. History of T2DM, PCOS and infertility, undergoing infertility procedures with [facility]. She denies any abnormal vaginal discharge, bleeding, pelvic pain or other concerns.    Past Medical History:  Diagnosis Date   Abnormal uterine bleeding 10/13/2020   Anxiety and depression    Biliary colic 04/10/2019   Abd [place] 03/2019 - cholelithiasis, fatty liver   Borderline diabetes    Ectopic pregnancy 2016   Fatty liver    PCOS (polycystic ovarian syndrome)    Pelvic pain 10/13/2020   Pre-diabetes     Past Surgical History:  Procedure Laterality Date   CHOLECYSTECTOMY N/A 06/10/2020   Procedure: LAPAROSCOPIC CHOLECYSTECTOMY;  Surgeon: Luretha Murphy, MD;  Location: [facility];  Service: General;  Laterality: N/A;   DILATION AND CURETTAGE OF UTERUS      The following portions of the patient's history were reviewed and updated as appropriate: allergies, current medications, past family history, past medical history, past social history, past surgical history and problem list.   Health Maintenance:  Normal pap and negative HRHPV on 11/09/2021.  Ordered for mammogram soon.   Review of Systems:  Pertinent items noted in HPI and remainder of comprehensive ROS otherwise negative.  Physical Exam:  BP 110/72   Wt 255 lb (115.7 kg)   LMP 11/02/2022 (Exact Date)   BMI 39.35 kg/m  CONSTITUTIONAL: Well-developed, well-nourished female in no acute distress.  HEENT:  Normocephalic, atraumatic. External right and left ear normal. No scleral icterus.  NECK: Normal range of motion, supple, no masses noted on observation SKIN: No rash noted. Not diaphoretic. No erythema. No pallor. MUSCULOSKELETAL: Normal range of motion. No edema noted. NEUROLOGIC: Alert and oriented to person, place, and time. Normal muscle tone coordination. No cranial nerve deficit noted. PSYCHIATRIC:  Normal mood and affect. Normal behavior. Normal judgment and thought content. CARDIOVASCULAR: Normal heart rate noted RESPIRATORY: Effort and breath sounds normal, no problems with respiration noted ABDOMEN: No masses noted. No other overt distention noted.   PELVIC: Deferred  Labs and Imaging Results for orders placed or performed in visit on 11/13/22 (from the past 168 hour(s))  Lipid panel   Collection Time: 11/13/22 12:26 PM  Result Value Ref Range   Cholesterol 189 0 - 200 mg/dL   Triglycerides 161.0 (H) 0.0 - 149.0 mg/dL   HDL 96.04 >54.09 mg/dL   VLDL 81.1 0.0 - 91.4 mg/dL   LDL Cholesterol 782 (H) 0 - 99 mg/dL   Total CHOL/HDL Ratio 4    NonHDL 141.44   Comprehensive metabolic panel   Collection Time: 11/13/22 12:26 PM  Result Value Ref Range   Sodium 137 135 - 145 mEq/L   Potassium 4.3 3.5 - 5.1 mEq/L   Chloride 103 96 - 112 mEq/L   CO2 25 19 - 32 mEq/L   Glucose, Bld 81 70 - 99 mg/dL   BUN 11 6 - 23 mg/dL   Creatinine, Ser 9.56 0.40 - 1.20 mg/dL   Total Bilirubin 0.5 0.2 - 1.2 mg/dL   Alkaline Phosphatase 70 39 - 117 U/L   AST 27 0 - 37 U/L   ALT 28 0 - 35 U/L   Total Protein 7.1 6.0 - 8.3 g/dL   Albumin 4.5 3.5 - 5.2 g/dL   GFR 21.30 >86.57 mL/min   Calcium 9.7 8.4 - 10.5 mg/dL  Hemoglobin Q4O   Collection Time: 11/13/22 12:26 PM  Result  Value Ref Range   Hgb A1c MFr Bld 5.5 4.6 - 6.5 %   No results found.    Assessment and Plan:     1. PCOS (polycystic ovarian syndrome) 2. Infertility due to PCOS 3. Encounter to establish care with new doctor She was welcomed to our practice.  The nature of  [facility] with multiple MDs and other Advanced Practice Providers was explained to patient; also emphasized that residents, students are part of our team. All questions answered. She will continue to follow up with [facility] and will transfer her care to [place] once she has a viable pregnancy.  Routine preventative  health maintenance measures emphasized. Please refer to After Visit Summary for other counseling recommendations.   Return for any gynecologic concerns.    I spent 30 minutes dedicated to the care of this patient including pre-visit review of records, face to face time with the patient discussing her conditions and treatments and post visit orders.    Jaynie Collins, MD, FACOG Obstetrician & Gynecologist, [facility] for [employer], [facility]

## 2022-11-23 ENCOUNTER — Ambulatory Visit
Admission: RE | Admit: 2022-11-23 | Discharge: 2022-11-23 | Disposition: A | Discharge status: Home / Self Care | Payer: BC Managed Care – PPO | Source: Ambulatory Visit | Attending: Primary Care | Admitting: Primary Care

## 2022-11-23 DIAGNOSIS — Z1231 Encounter for screening mammogram for malignant neoplasm of breast: Secondary | ICD-10-CM | POA: Insufficient documentation

## 2022-12-10 DIAGNOSIS — Z3183 Encounter for assisted reproductive fertility procedure cycle: Secondary | ICD-10-CM | POA: Diagnosis not present

## 2022-12-10 DIAGNOSIS — N979 Female infertility, unspecified: Secondary | ICD-10-CM | POA: Diagnosis not present

## 2022-12-12 DIAGNOSIS — Z3183 Encounter for assisted reproductive fertility procedure cycle: Secondary | ICD-10-CM | POA: Diagnosis not present

## 2022-12-12 DIAGNOSIS — N979 Female infertility, unspecified: Secondary | ICD-10-CM | POA: Diagnosis not present

## 2022-12-13 DIAGNOSIS — N979 Female infertility, unspecified: Secondary | ICD-10-CM | POA: Diagnosis not present

## 2022-12-13 DIAGNOSIS — Z3183 Encounter for assisted reproductive fertility procedure cycle: Secondary | ICD-10-CM | POA: Diagnosis not present

## 2022-12-15 DIAGNOSIS — N979 Female infertility, unspecified: Secondary | ICD-10-CM | POA: Diagnosis not present

## 2022-12-15 DIAGNOSIS — Z3183 Encounter for assisted reproductive fertility procedure cycle: Secondary | ICD-10-CM | POA: Diagnosis not present

## 2022-12-15 DIAGNOSIS — E282 Polycystic ovarian syndrome: Secondary | ICD-10-CM | POA: Diagnosis not present

## 2022-12-18 DIAGNOSIS — N979 Female infertility, unspecified: Secondary | ICD-10-CM | POA: Diagnosis not present

## 2022-12-18 DIAGNOSIS — Z319 Encounter for procreative management, unspecified: Secondary | ICD-10-CM | POA: Diagnosis not present

## 2022-12-18 DIAGNOSIS — Z3183 Encounter for assisted reproductive fertility procedure cycle: Secondary | ICD-10-CM | POA: Diagnosis not present

## 2022-12-19 DIAGNOSIS — Z3183 Encounter for assisted reproductive fertility procedure cycle: Secondary | ICD-10-CM | POA: Diagnosis not present

## 2022-12-19 DIAGNOSIS — N979 Female infertility, unspecified: Secondary | ICD-10-CM | POA: Diagnosis not present

## 2022-12-20 DIAGNOSIS — Z3183 Encounter for assisted reproductive fertility procedure cycle: Secondary | ICD-10-CM | POA: Diagnosis not present

## 2022-12-20 DIAGNOSIS — N979 Female infertility, unspecified: Secondary | ICD-10-CM | POA: Diagnosis not present

## 2022-12-22 DIAGNOSIS — N979 Female infertility, unspecified: Secondary | ICD-10-CM | POA: Diagnosis not present

## 2022-12-22 DIAGNOSIS — Z3183 Encounter for assisted reproductive fertility procedure cycle: Secondary | ICD-10-CM | POA: Diagnosis not present

## 2022-12-27 DIAGNOSIS — Z3183 Encounter for assisted reproductive fertility procedure cycle: Secondary | ICD-10-CM | POA: Diagnosis not present

## 2022-12-27 DIAGNOSIS — N979 Female infertility, unspecified: Secondary | ICD-10-CM | POA: Diagnosis not present

## 2022-12-28 DIAGNOSIS — Z3183 Encounter for assisted reproductive fertility procedure cycle: Secondary | ICD-10-CM | POA: Diagnosis not present

## 2022-12-28 DIAGNOSIS — N979 Female infertility, unspecified: Secondary | ICD-10-CM | POA: Diagnosis not present

## 2023-01-19 ENCOUNTER — Other Ambulatory Visit: Payer: Self-pay | Admitting: Primary Care

## 2023-01-19 DIAGNOSIS — E119 Type 2 diabetes mellitus without complications: Secondary | ICD-10-CM

## 2023-01-19 MED ORDER — OZEMPIC (0.25 OR 0.5 MG/DOSE) 2 MG/3ML ~~LOC~~ SOPN
PEN_INJECTOR | SUBCUTANEOUS | 0 refills | Status: DC
Start: 2023-01-19 — End: 2023-05-22

## 2023-01-19 NOTE — Telephone Encounter (Signed)
From: Roxy Horseman To: Office of Doreene Nest, NP Sent: 01/18/2023 2:35 PM EDT Subject: Medication Renewal Request  Refills have been requested for the following medications:   Semaglutide,0.25 or 0.5MG /DOS, (OZEMPIC, 0.25 OR 0.5 MG/DOSE,) 2 MG/3ML SOPN [Ariez Neilan K Joella Saefong]  Preferred pharmacy: [id number] [place], [place] - [address] AT [address] Delivery method: Pickup

## 2023-03-07 DIAGNOSIS — Z3183 Encounter for assisted reproductive fertility procedure cycle: Secondary | ICD-10-CM | POA: Diagnosis not present

## 2023-03-07 DIAGNOSIS — N979 Female infertility, unspecified: Secondary | ICD-10-CM | POA: Diagnosis not present

## 2023-03-12 ENCOUNTER — Ambulatory Visit (INDEPENDENT_AMBULATORY_CARE_PROVIDER_SITE_OTHER): Payer: BC Managed Care – PPO | Admitting: Primary Care

## 2023-03-12 VITALS — BP 110/62 | HR 75 | Temp 97.6°F | Ht 67.5 in | Wt 259.0 lb

## 2023-03-12 DIAGNOSIS — M79671 Pain in right foot: Secondary | ICD-10-CM | POA: Diagnosis not present

## 2023-03-12 NOTE — Patient Instructions (Signed)
Continue ibuprofen.  Let me know if the pain progresses/does not resolve.  It was a pleasure to see you today!

## 2023-03-12 NOTE — Assessment & Plan Note (Signed)
Differentials include tendinitis versus mild arthritis.  Low suspicion for gout, does not resemble neuropathy.  Fortunately, symptoms have improved with anti-inflammatory medication.  Discussed to continue ibuprofen as needed. Exam today grossly benign.  Consider x-ray and labs if pain returns/becomes recurrent.  Discussed comfortable shoes, etc.

## 2023-03-12 NOTE — Progress Notes (Signed)
Subjective:    Patient ID: Emily Banks, female    DOB: 05/03/1981, 42 y.o.   MRN: [id number]  Foot Pain Pertinent negatives include no numbness or rash.    Emily Banks is a very pleasant 42 y.o. female with a history of carpal tunnel syndrome, type 2 diabetes who presents today to discuss foot pain.  Her pain is located to the center right dorsal foot which began about 1 month ago. Her pain is worse with rest and sitting, improved some with walking. She describes her pain as "a bruise" now, but her pain was much worse last week. She's applied ice and Biofreze. Also taking Ibuprofen 600 mg daily for a few days.   She denies injury/trauma, changes in color, swelling. She's feeling much better now.   Review of Systems  Musculoskeletal:        Right dorsal foot pain  Skin:  Negative for color change and rash.  Neurological:  Negative for numbness.         Past Medical History:  Diagnosis Date   Abnormal uterine bleeding 10/13/2020   Anxiety and depression    Biliary colic 04/10/2019   Abd [place] 03/2019 - cholelithiasis, fatty liver   Borderline diabetes    Ectopic pregnancy 2016   Fatty liver    PCOS (polycystic ovarian syndrome)    Pelvic pain 10/13/2020   Pre-diabetes     Social History   Socioeconomic History   Marital status: Married    Spouse name: Not on file   Number of children: Not on file   Years of education: Not on file   Highest education level: Associate degree: occupational, Scientist, product/process development, or vocational program  Occupational History   Not on file  Tobacco Use   Smoking status: Former   Smokeless tobacco: Never  Vaping Use   Vaping status: Every Day   Start date: 06/04/2015  Substance and Sexual Activity   Alcohol use: Yes    Alcohol/week: 0.0 standard drinks of alcohol    Comment: soical   Drug use: Never   Sexual activity: Not on file  Other Topics Concern   Not on file  Social History Narrative   Married.   No children.   Works at  [employer].   Enjoys playing games, spending time with family.   Social Determinants of Health   Financial Resource Strain: Medium Risk (03/11/2023)   Overall Financial Resource Strain (CARDIA)    Difficulty of Paying Living Expenses: Somewhat hard  Food Insecurity: No Food Insecurity (03/11/2023)   Hunger Vital Sign    Worried About Running Out of Food in the Last Year: Never true    Ran Out of Food in the Last Year: Never true  Transportation Needs: No Transportation Needs (03/11/2023)   PRAPARE - Administrator, Civil Service (Medical): No    Lack of Transportation (Non-Medical): No  Physical Activity: Insufficiently Active (03/11/2023)   Exercise Vital Sign    Days of Exercise per Week: 7 days    Minutes of Exercise per Session: 20 min  Stress: Stress Concern Present (03/11/2023)   [employer] of Occupational Health - Occupational Stress Questionnaire    Feeling of Stress : To some extent  Social Connections: Moderately Isolated (03/11/2023)   Social Connection and Isolation Panel [NHANES]    Frequency of Communication with Friends and Family: Twice a week    Frequency of Social Gatherings with Friends and Family: Once a week    Attends Religious Services: Never  Active Member of Clubs or Organizations: No    Attends Banker Meetings: Not on file    Marital Status: Married  Intimate Partner Violence: Not on file    Past Surgical History:  Procedure Laterality Date   CHOLECYSTECTOMY N/A 06/10/2020   Procedure: LAPAROSCOPIC CHOLECYSTECTOMY;  Surgeon: Luretha Murphy, MD;  Location: [facility];  Service: General;  Laterality: N/A;   DILATION AND CURETTAGE OF UTERUS      Family History  Problem Relation Age of Onset   Diabetes Father    Hypertension Father    Heart disease Father    Melanoma Father    Hyperlipidemia Mother    Diabetes Brother    Hypertension Brother    Ovarian cancer Paternal Aunt    Esophageal cancer Maternal Grandmother      No Known Allergies  Current Outpatient Medications on File Prior to Visit  Medication Sig Dispense Refill   doxycycline (VIBRA-TABS) 100 MG tablet Take 100 mg by mouth 2 (two) times daily.     estradiol (ESTRACE) 2 MG tablet      estradiol (VIVELLE-DOT) 0.1 MG/24HR patch Place onto the skin.     Ganirelix Acetate 250 MCG/0.5ML SOSY Inject into the skin.     hydrOXYzine (ATARAX/VISTARIL) 10 MG tablet Take 1-2 tablets (10-20 mg total) by mouth 2 (two) times daily as needed for anxiety. 60 tablet 0   leuprolide (LUPRON) 1 MG/0.2ML injection Inject into the skin.     levothyroxine (SYNTHROID) 25 MCG tablet Take 25 mcg by mouth daily.     metFORMIN (GLUCOPHAGE) 1000 MG tablet Take 1,000 mg by mouth 2 (two) times daily.     Semaglutide,0.25 or 0.5MG /DOS, (OZEMPIC, 0.25 OR 0.5 MG/DOSE,) 2 MG/3ML SOPN INJECT 0.5 MG INTO THE SKIN ONCE A WEEK. FOR DIABETES. 9 mL 0   sertraline (ZOLOFT) 100 MG tablet Take 1 tablet (100 mg total) by mouth daily. For anxiety and depression. 90 tablet 3   triamcinolone ointment (KENALOG) 0.5 % Apply 1 Application topically 2 (two) times daily. 30 g 0   No current facility-administered medications on file prior to visit.    BP 110/62   Pulse 75   Temp 97.6 F (36.4 C) (Temporal)   Ht 5' 7.5" (1.715 m)   Wt 259 lb (117.5 kg)   SpO2 99%   BMI 39.97 kg/m  Objective:   Physical Exam Constitutional:      General: She is not in acute distress. Cardiovascular:     Pulses:          Posterior tibial pulses are 2+ on the right side.  Musculoskeletal:     Right foot: Normal range of motion. No swelling, deformity, tenderness or bony tenderness. Normal pulse.       Feet:  Skin:    General: Skin is warm and dry.     Findings: No erythema.           Assessment & Plan:  Acute foot pain, right Assessment & Plan: Differentials include tendinitis versus mild arthritis.  Low suspicion for gout, does not resemble neuropathy.  Fortunately, symptoms have  improved with anti-inflammatory medication.  Discussed to continue ibuprofen as needed. Exam today grossly benign.  Consider x-ray and labs if pain returns/becomes recurrent.  Discussed comfortable shoes, etc.         Doreene Nest, NP

## 2023-03-14 DIAGNOSIS — N979 Female infertility, unspecified: Secondary | ICD-10-CM | POA: Diagnosis not present

## 2023-03-14 DIAGNOSIS — Z319 Encounter for procreative management, unspecified: Secondary | ICD-10-CM | POA: Diagnosis not present

## 2023-04-01 DIAGNOSIS — Z3183 Encounter for assisted reproductive fertility procedure cycle: Secondary | ICD-10-CM | POA: Diagnosis not present

## 2023-04-25 DIAGNOSIS — Z3183 Encounter for assisted reproductive fertility procedure cycle: Secondary | ICD-10-CM | POA: Diagnosis not present

## 2023-05-01 DIAGNOSIS — Z3183 Encounter for assisted reproductive fertility procedure cycle: Secondary | ICD-10-CM | POA: Diagnosis not present

## 2023-05-09 DIAGNOSIS — Z3183 Encounter for assisted reproductive fertility procedure cycle: Secondary | ICD-10-CM | POA: Diagnosis not present

## 2023-05-15 ENCOUNTER — Ambulatory Visit: Payer: BC Managed Care – PPO | Admitting: Primary Care

## 2023-05-22 ENCOUNTER — Encounter: Payer: Self-pay | Admitting: Primary Care

## 2023-05-22 ENCOUNTER — Ambulatory Visit (INDEPENDENT_AMBULATORY_CARE_PROVIDER_SITE_OTHER): Payer: BC Managed Care – PPO | Admitting: Primary Care

## 2023-05-22 VITALS — BP 126/82 | HR 76 | Temp 97.2°F | Ht 67.5 in | Wt 271.0 lb

## 2023-05-22 DIAGNOSIS — Z7984 Long term (current) use of oral hypoglycemic drugs: Secondary | ICD-10-CM

## 2023-05-22 DIAGNOSIS — E1165 Type 2 diabetes mellitus with hyperglycemia: Secondary | ICD-10-CM | POA: Diagnosis not present

## 2023-05-22 LAB — POCT GLYCOSYLATED HEMOGLOBIN (HGB A1C): Hemoglobin A1C: 5.7 % — AB (ref 4.0–5.6)

## 2023-05-22 LAB — MICROALBUMIN / CREATININE URINE RATIO
Creatinine,U: 122.4 mg/dL
Microalb Creat Ratio: 0.6 mg/g (ref 0.0–30.0)
Microalb, Ur: <0.7 mg/dL (ref 0.0–1.9)

## 2023-05-22 NOTE — Patient Instructions (Signed)
Continue metformin 1000 mg twice daily for diabetes.  Stop by the lab prior to leaving today. I will notify you of your results once received.   Please schedule a physical to meet with me in 6 months.   It was a pleasure to see you today!

## 2023-05-22 NOTE — Progress Notes (Signed)
Subjective:    Patient ID: Emily Banks, female    DOB: [birth year], 42 y.o.   MRN: [id number]  HPI  Emily Banks is a very pleasant 42 y.o. female with a history of type 2 diabetes, PCOS, fatty liver, elevated blood pressure reading who presents today for follow up of diabetes.  Current medications include: Metformin 1000 mg BID, Ozempic 0.5 mg weekly. She discontinued Ozempic several months ago due to IVF.   She is checking her blood glucose on occasion, doesn't recall the last reading.   Last A1C: 5.5 in April 2024 Last Eye Exam: Due, she will schedule.  Last Foot Exam: Due today  Pneumonia Vaccination: 2023 Urine Microalbumin: Due Statin: None. Currently pregnant   Dietary changes since last visit: Increased intake of veggies.    Exercise: Walking  Wt Readings from Last 3 Encounters:  05/22/23 271 lb (122.9 kg)  03/12/23 259 lb (117.5 kg)  11/20/22 255 lb (115.7 kg)   BP Readings from Last 3 Encounters:  05/22/23 126/82  03/12/23 110/62  11/20/22 110/72         Review of Systems  Respiratory:  Negative for shortness of breath.   Cardiovascular:  Negative for chest pain.  Gastrointestinal:  Negative for diarrhea.  Neurological:  Negative for dizziness and numbness.         Past Medical History:  Diagnosis Date   Abnormal uterine bleeding 10/13/2020   Anxiety and depression    Biliary colic 04/10/2019   Abd [place] 03/2019 - cholelithiasis, fatty liver   Borderline diabetes    Ectopic pregnancy 2016   Fatty liver    PCOS (polycystic ovarian syndrome)    Pelvic pain 10/13/2020   Pre-diabetes     Social History   Socioeconomic History   Marital status: Married    Spouse name: Not on file   Number of children: Not on file   Years of education: Not on file   Highest education level: Associate degree: occupational, Scientist, product/process development, or vocational program  Occupational History   Not on file  Tobacco Use   Smoking status: Former   Smokeless  tobacco: Never  Vaping Use   Vaping status: Every Day   Start date: 06/04/2015  Substance and Sexual Activity   Alcohol use: Yes    Alcohol/week: 0.0 standard drinks of alcohol    Comment: soical   Drug use: Never   Sexual activity: Not on file  Other Topics Concern   Not on file  Social History Narrative   Married.   No children.   Works at [employer].   Enjoys playing games, spending time with family.   Social Determinants of Health   Financial Resource Strain: Medium Risk (05/15/2023)   Overall Financial Resource Strain (CARDIA)    Difficulty of Paying Living Expenses: Somewhat hard  Food Insecurity: No Food Insecurity (05/15/2023)   Hunger Vital Sign    Worried About Running Out of Food in the Last Year: Never true    Ran Out of Food in the Last Year: Never true  Transportation Needs: No Transportation Needs (05/15/2023)   PRAPARE - Administrator, Civil Service (Medical): No    Lack of Transportation (Non-Medical): No  Physical Activity: Insufficiently Active (05/15/2023)   Exercise Vital Sign    Days of Exercise per Week: 7 days    Minutes of Exercise per Session: 20 min  Stress: No Stress Concern Present (05/15/2023)   [employer] of Occupational Health - Occupational Stress Questionnaire  Feeling of Stress : Only a little  Recent Concern: Stress - Stress Concern Present (03/11/2023)   [employer] of Occupational Health - Occupational Stress Questionnaire    Feeling of Stress : To some extent  Social Connections: Moderately Isolated (05/15/2023)   Social Connection and Isolation Panel [NHANES]    Frequency of Communication with Friends and Family: More than three times a week    Frequency of Social Gatherings with Friends and Family: Once a week    Attends Religious Services: Never    Database administrator or Organizations: No    Attends Engineer, structural: Not on file    Marital Status: Married  Catering manager Violence:  Not on file    Past Surgical History:  Procedure Laterality Date   CHOLECYSTECTOMY N/A 06/10/2020   Procedure: LAPAROSCOPIC CHOLECYSTECTOMY;  Surgeon: Luretha Murphy, MD;  Location: [facility];  Service: General;  Laterality: N/A;   DILATION AND CURETTAGE OF UTERUS      Family History  Problem Relation Age of Onset   Diabetes Father    Hypertension Father    Heart disease Father    Melanoma Father    Hyperlipidemia Mother    Diabetes Brother    Hypertension Brother    Ovarian cancer Paternal Aunt    Esophageal cancer Maternal Grandmother     No Known Allergies  Current Outpatient Medications on File Prior to Visit  Medication Sig Dispense Refill   estradiol (ESTRACE) 2 MG tablet      estradiol (VIVELLE-DOT) 0.1 MG/24HR patch Place onto the skin.     hydrOXYzine (ATARAX/VISTARIL) 10 MG tablet Take 1-2 tablets (10-20 mg total) by mouth 2 (two) times daily as needed for anxiety. 60 tablet 0   levothyroxine (SYNTHROID) 25 MCG tablet Take 25 mcg by mouth daily.     metFORMIN (GLUCOPHAGE) 1000 MG tablet Take 1,000 mg by mouth 2 (two) times daily.     sertraline (ZOLOFT) 100 MG tablet Take 1 tablet (100 mg total) by mouth daily. For anxiety and depression. 90 tablet 3   doxycycline (VIBRA-TABS) 100 MG tablet Take 100 mg by mouth 2 (two) times daily.     Ganirelix Acetate 250 MCG/0.5ML SOSY Inject into the skin.     leuprolide (LUPRON) 1 MG/0.2ML injection Inject into the skin.     triamcinolone ointment (KENALOG) 0.5 % Apply 1 Application topically 2 (two) times daily. (Patient not taking: Reported on 05/22/2023) 30 g 0   No current facility-administered medications on file prior to visit.    BP 126/82   Pulse 76   Temp (!) 97.2 F (36.2 C) (Temporal)   Ht 5' 7.5" (1.715 m)   Wt 271 lb (122.9 kg)   SpO2 98%   BMI 41.82 kg/m  Objective:   Physical Exam Cardiovascular:     Rate and Rhythm: Normal rate and regular rhythm.  Pulmonary:     Effort: Pulmonary effort is normal.      Breath sounds: Normal breath sounds.  Musculoskeletal:     Cervical back: Neck supple.  Skin:    General: Skin is warm and dry.  Neurological:     Mental Status: She is alert and oriented to person, place, and time.  Psychiatric:        Mood and Affect: Mood normal.           Assessment & Plan:  Type 2 diabetes mellitus with hyperglycemia, without long-term current use of insulin (HCC) Assessment & Plan: Controlled with A1C of  5.7 today.   Continue metformin 1000 mg BID.   Foot exam today. Urine microalbumin pending.  Follow up in 6 months.   Orders: -     POCT glycosylated hemoglobin (Hb A1C) -     Microalbumin / creatinine urine ratio        Doreene Nest, NP

## 2023-05-22 NOTE — Assessment & Plan Note (Signed)
Controlled with A1C of 5.7 today.   Continue metformin 1000 mg BID.   Foot exam today. Urine microalbumin pending.  Follow up in 6 months.

## 2023-05-23 DIAGNOSIS — Z3200 Encounter for pregnancy test, result unknown: Secondary | ICD-10-CM | POA: Diagnosis not present

## 2023-06-06 DIAGNOSIS — O0900 Supervision of pregnancy with history of infertility, unspecified trimester: Secondary | ICD-10-CM | POA: Diagnosis not present

## 2023-07-25 ENCOUNTER — Encounter: Payer: Self-pay | Admitting: Obstetrics and Gynecology

## 2023-07-25 ENCOUNTER — Other Ambulatory Visit (HOSPITAL_COMMUNITY)
Admission: RE | Admit: 2023-07-25 | Discharge: 2023-07-25 | Disposition: A | Discharge status: Home / Self Care | Payer: BC Managed Care – PPO | Source: Ambulatory Visit | Attending: Obstetrics and Gynecology | Admitting: Obstetrics and Gynecology

## 2023-07-25 ENCOUNTER — Ambulatory Visit (INDEPENDENT_AMBULATORY_CARE_PROVIDER_SITE_OTHER): Payer: BC Managed Care – PPO | Admitting: Obstetrics and Gynecology

## 2023-07-25 VITALS — BP 136/84 | HR 84 | Wt 261.2 lb

## 2023-07-25 DIAGNOSIS — O99212 Obesity complicating pregnancy, second trimester: Secondary | ICD-10-CM

## 2023-07-25 DIAGNOSIS — O0901 Supervision of pregnancy with history of infertility, first trimester: Secondary | ICD-10-CM | POA: Insufficient documentation

## 2023-07-25 DIAGNOSIS — Z1339 Encounter for screening examination for other mental health and behavioral disorders: Secondary | ICD-10-CM | POA: Diagnosis not present

## 2023-07-25 DIAGNOSIS — O99210 Obesity complicating pregnancy, unspecified trimester: Secondary | ICD-10-CM | POA: Insufficient documentation

## 2023-07-25 DIAGNOSIS — Z87898 Personal history of other specified conditions: Secondary | ICD-10-CM | POA: Insufficient documentation

## 2023-07-25 DIAGNOSIS — Z3A14 14 weeks gestation of pregnancy: Secondary | ICD-10-CM | POA: Insufficient documentation

## 2023-07-25 DIAGNOSIS — O09819 Supervision of pregnancy resulting from assisted reproductive technology, unspecified trimester: Secondary | ICD-10-CM | POA: Insufficient documentation

## 2023-07-25 DIAGNOSIS — F419 Anxiety disorder, unspecified: Secondary | ICD-10-CM

## 2023-07-25 DIAGNOSIS — O0990 Supervision of high risk pregnancy, unspecified, unspecified trimester: Secondary | ICD-10-CM | POA: Insufficient documentation

## 2023-07-25 DIAGNOSIS — Z6841 Body Mass Index (BMI) 40.0 and over, adult: Secondary | ICD-10-CM

## 2023-07-25 DIAGNOSIS — O0902 Supervision of pregnancy with history of infertility, second trimester: Secondary | ICD-10-CM | POA: Diagnosis not present

## 2023-07-25 DIAGNOSIS — O09512 Supervision of elderly primigravida, second trimester: Secondary | ICD-10-CM | POA: Diagnosis not present

## 2023-07-25 DIAGNOSIS — O09529 Supervision of elderly multigravida, unspecified trimester: Secondary | ICD-10-CM | POA: Insufficient documentation

## 2023-07-25 DIAGNOSIS — O99342 Other mental disorders complicating pregnancy, second trimester: Secondary | ICD-10-CM

## 2023-07-25 HISTORY — DX: Supervision of pregnancy resulting from assisted reproductive technology, unspecified trimester: O09.819

## 2023-07-25 LAB — CERVICOVAGINAL ANCILLARY ONLY
Bacterial Vaginitis (gardnerella): NEGATIVE
Candida Glabrata: NEGATIVE
Candida Vaginitis: NEGATIVE
Chlamydia: NEGATIVE
Comment: NEGATIVE
Comment: NEGATIVE
Comment: NEGATIVE
Comment: NEGATIVE
Comment: NEGATIVE
Comment: NORMAL
Neisseria Gonorrhea: NEGATIVE
Trichomonas: NEGATIVE

## 2023-07-25 NOTE — Progress Notes (Signed)
New OB 

## 2023-07-25 NOTE — Assessment & Plan Note (Signed)
  NURSING  PROVIDER  Office Location [facility] Dating by   [facility] Model Traditional Anatomy U/S   Initiated care at  wks                Language  English              LAB RESULTS   Support Person  Genetics NIPS:  AFP:     NT/IT (FT only)     Carrier Screen Horizon:   Rhogam    A1C/GTT Early HgbA1C:  Third trimester 2 hr GTT:   Flu Vaccine Already     TDaP Vaccine   Blood Type @ABORHRESULTS   RSV Vaccine  Antibody    COVID Vaccine 2 Rubella    Feeding Plan breast RPR    Contraception  HBsAg    Circumcision  HIV    Pediatrician   HCVAb    Prenatal Classes     BTL Consent  Pap Diagnosis  Date Value Ref Range Status  11/09/2021   Final   - Negative for intraepithelial lesion or malignancy (NILM)    BTL Pre-payment  GC/CT Initial:   36wks:    VBAC Consent  GBS   For PCN allergy, check sensitivities   BRx Optimized? [ ]  yes   [ ]  no    DME Rx [ ]  BP cuff [ ]  Weight Scale Waterbirth  [ ]  Class [ ]  Consent [ ]  CNM visit  PHQ9 & GAD7 [  ] new OB [  ] 28 weeks  [  ] 36 weeks Induction  [ ]  Orders Entered [ ] Foley Y/N

## 2023-07-25 NOTE — Progress Notes (Signed)
 New OB Note  07/25/2023   Clinic: [facility]  Chief Complaint: new OB  Transfer of Care Patient: yes [facility])  History of Present Illness: Emily Banks is a 43 y.o. G2P0010 at 14/6 weeks (EDC 6/27, based on IVF).   Preg complicated by has Anxiety and depression; Elevated blood pressure reading; Type 2 diabetes mellitus with hyperglycemia (HCC); Fatty liver; Acute foot pain, right; Newborn product of IVF pregnancy; BMI 40.0-44.9, adult (HCC); Obesity in pregnancy; Encounter for supervision of high-risk pregnancy with history of infertility in first trimester; and History of borderline diabetes mellitus on their problem list.   Doing well. No complaints or issues  ROS: A 12-point review of systems was performed and negative, except as stated in the above HPI.  OBGYN History: As per HPI. OB History  Gravida Para Term Preterm AB Living  2    1   SAB IAB Ectopic Multiple Live Births    1      # Outcome Date GA Lbr Len/2nd Weight Sex Type Anes PTL Lv  2 Current           1 Ectopic            History of pap smears: Yes. Last pap smear 2023 and results were negative   Past Medical History: Past Medical History:  Diagnosis Date   Abnormal uterine bleeding 10/13/2020   Anxiety and depression    Biliary colic 04/10/2019   Abd [place]  03/2019 - cholelithiasis, fatty liver   Borderline diabetes    Carpal tunnel syndrome 04/09/2017   Dermatitis 01/03/2021   Ectopic pregnancy 2016   Fatty liver    PCOS (polycystic ovarian syndrome)    Pelvic pain 10/13/2020   Pre-diabetes     Past Surgical History: Past Surgical History:  Procedure Laterality Date   CHOLECYSTECTOMY N/A 06/10/2020   Procedure: LAPAROSCOPIC CHOLECYSTECTOMY;  Surgeon: Gladis Cough, MD;  Location: [facility];  Service: General;  Laterality: N/A;   DILATION AND CURETTAGE OF UTERUS      Family History:  Family History  Problem Relation Age of Onset   Diabetes Father     Hypertension Father    Heart disease Father    Melanoma Father    Hyperlipidemia Mother    Diabetes Brother    Hypertension Brother    Ovarian cancer Paternal Aunt    Esophageal cancer Maternal Grandmother     Social History:  Social History   Socioeconomic History   Marital status: Married    Spouse name: Not on file   Number of children: Not on file   Years of education: Not on file   Highest education level: Associate degree: occupational, scientist, product/process development, or vocational program  Occupational History   Not on file  Tobacco Use   Smoking status: Former   Smokeless tobacco: Never  Vaping Use   Vaping status: Every Day   Start date: 06/04/2015  Substance and Sexual Activity   Alcohol use: Yes    Alcohol/week: 0.0 standard drinks of alcohol    Comment: soical   Drug use: Never   Sexual activity: Not on file  Other Topics Concern   Not on file  Social History Narrative   Married.   Enjoys playing games, spending time with family.   Social Drivers of Health   Financial Resource Strain: Medium Risk (05/15/2023)   Overall Financial Resource Strain (CARDIA)    Difficulty of Paying Living Expenses: Somewhat hard  Food Insecurity: No Food Insecurity (05/15/2023)  Hunger Vital Sign    Worried About Running Out of Food in the Last Year: Never true    Ran Out of Food in the Last Year: Never true  Transportation Needs: No Transportation Needs (05/15/2023)   PRAPARE - Administrator, Civil Service (Medical): No    Lack of Transportation (Non-Medical): No  Physical Activity: Insufficiently Active (05/15/2023)   Exercise Vital Sign    Days of Exercise per Week: 7 days    Minutes of Exercise per Session: 20 min  Stress: No Stress Concern Present (05/15/2023)   [employer] of Occupational Health - Occupational Stress Questionnaire    Feeling of Stress : Only a little  Recent Concern: Stress - Stress Concern Present (03/11/2023)   [employer] of Occupational  Health - Occupational Stress Questionnaire    Feeling of Stress : To some extent  Social Connections: Moderately Isolated (05/15/2023)   Social Connection and Isolation Panel [NHANES]    Frequency of Communication with Friends and Family: More than three times a week    Frequency of Social Gatherings with Friends and Family: Once a week    Attends Religious Services: Never    Database Administrator or Organizations: No    Attends Engineer, Structural: Not on file    Marital Status: Married  Catering Manager Violence: Not on file   Allergy: No Known Allergies  Health Maintenance:  Mammogram Up to Date: yes, 11/2022  Current Outpatient Medications: Prenatal vitamin Metformin  1000 bid Synthroid  25 qday Sertraline  100 qday  Physical Exam:   BP 136/84   Pulse 84   Wt 261 lb 3.2 oz (118.5 kg)   LMP 11/02/2022 (Exact Date)   BMI 40.31 kg/m  Body mass index is 40.31 kg/m. Contractions: Not present Vag. Bleeding: None. FHTs: 150s  General appearance: Well nourished, well developed female in no acute distress.  Neck:  Supple, normal appearance, and no thyromegaly  Cardiovascular: S1, S2 normal, no murmur, rub or gallop, regular rate and rhythm Respiratory:  Clear to auscultation bilateral. Normal respiratory effort Abdomen: positive bowel sounds and no masses, hernias; diffusely non tender to palpation, non distended Neuro/Psych:  Normal mood and affect.  Skin:  Warm and dry.  Lymphatic:  No inguinal lymphadenopathy.   Pelvic exam: is not limited by body habitus EGBUS: within normal limits, Vagina: within normal limits and with no blood in the vault, Cervix: normal appearing cervix without discharge or lesions, closed/long/high, Uterus:  enlarged, c/w 14 week size, and Adnexa:  normal adnexa and no mass, fullness, tenderness  Laboratory: reviewed  Imaging:  reviewed  Assessment: patient doing well  Plan: 1. Encounter for supervision of high-risk pregnancy with  history of infertility in first trimester (Primary) Patient amenable to start low dose asa AFP next visit recommended MFM u/s ordered - CBC/D/Plt+RPR+Rh+ABO+RubIgG... - Culture, OB Urine - Hemoglobin A1c - TSH + free T4 - Comprehensive metabolic panel - Protein / creatinine ratio, urine - US  MFM OB DETAIL +14 WK; Future - Cervicovaginal ancillary only( [place])  2. [redacted] weeks gestation of pregnancy - CBC/D/Plt+RPR+Rh+ABO+RubIgG... - Culture, OB Urine - Hemoglobin A1c - TSH + free T4 - Comprehensive metabolic panel - Protein / creatinine ratio, urine - US  MFM OB DETAIL +14 WK; Future - Cervicovaginal ancillary only( [place])  3. BMI 40.0-44.9, adult (HCC) Watch stable.   4. Obesity in pregnancy  5. Newborn product of IVF pregnancy D/w her re: screening fetal echo at 22-28wks  6. Anxiety and depression  Doing well sertraline  and feels needs to continue  7. History of borderline diabetes mellitus Patient states she had DM2 but then resolved. She has been on metformin  bid during pre-conception and currently. She has supplies at home and to check sugars AM fasting and 2 hours post prandial and f/u in 2 weeks for CBG check. I told her if wnl, then can either stay on or come off and continue with blood sugar checks as metformin  does cross placenta and she may not need it.   8. AMA S/p normal PGT-A testing  Problem list reviewed and updated.  Follow up in 2 weeks for CBG check    >50% of 45 min visit spent on counseling and coordination of care.  Return in about 2 weeks (around 08/08/2023) for in person, md or app, md visit, high risk ob, blood sugar check .  Future Appointments  Date Time Provider Department Center  08/08/2023  9:35 AM Anyanwu, Gloris LABOR, MD [facility]  09/04/2023 10:15 AM [facility] NURSE [facility]  09/04/2023 10:30 AM [facility] US1 [facility]    Bebe Izell Overcast MD Attending [facility])

## 2023-07-26 LAB — TSH+FREE T4
Free T4: 0.79 ng/dL — ABNORMAL LOW (ref 0.82–1.77)
TSH: 1.63 u[IU]/mL (ref 0.450–4.500)

## 2023-07-26 LAB — CBC/D/PLT+RPR+RH+ABO+RUBIGG...
Antibody Screen: NEGATIVE
Basophils Absolute: 0.1 10*3/uL (ref 0.0–0.2)
Basos: 1 %
EOS (ABSOLUTE): 0.2 10*3/uL (ref 0.0–0.4)
Eos: 2 %
HCV Ab: NONREACTIVE
HIV Screen 4th Generation wRfx: NONREACTIVE
Hematocrit: 40.3 % (ref 34.0–46.6)
Hemoglobin: 13.1 g/dL (ref 11.1–15.9)
Hepatitis B Surface Ag: NEGATIVE
Immature Grans (Abs): 0.1 10*3/uL (ref 0.0–0.1)
Immature Granulocytes: 1 %
Lymphocytes Absolute: 1.7 10*3/uL (ref 0.7–3.1)
Lymphs: 16 %
MCH: 29 pg (ref 26.6–33.0)
MCHC: 32.5 g/dL (ref 31.5–35.7)
MCV: 89 fL (ref 79–97)
Monocytes Absolute: 0.6 10*3/uL (ref 0.1–0.9)
Monocytes: 6 %
Neutrophils Absolute: 7.9 10*3/uL — ABNORMAL HIGH (ref 1.4–7.0)
Neutrophils: 74 %
Platelets: 293 10*3/uL (ref 150–450)
RBC: 4.52 x10E6/uL (ref 3.77–5.28)
RDW: 12.9 % (ref 11.7–15.4)
RPR Ser Ql: NONREACTIVE
Rh Factor: POSITIVE
Rubella Antibodies, IGG: 1.04 {index} (ref >0.99)
WBC: 10.5 10*3/uL (ref 3.4–10.8)

## 2023-07-26 LAB — HCV INTERPRETATION

## 2023-07-26 LAB — COMPREHENSIVE METABOLIC PANEL
ALT: 23 [IU]/L (ref 0–32)
AST: 30 [IU]/L (ref 0–40)
Albumin: 4.3 g/dL (ref 3.9–4.9)
Alkaline Phosphatase: 63 [IU]/L (ref 44–121)
BUN/Creatinine Ratio: 11 (ref 9–23)
BUN: 8 mg/dL (ref 6–24)
Bilirubin Total: 0.3 mg/dL (ref 0.0–1.2)
CO2: 17 mmol/L — ABNORMAL LOW (ref 20–29)
Calcium: 9.5 mg/dL (ref 8.7–10.2)
Chloride: 102 mmol/L (ref 96–106)
Creatinine, Ser: 0.73 mg/dL (ref 0.57–1.00)
Globulin, Total: 2.4 g/dL (ref 1.5–4.5)
Glucose: 104 mg/dL — ABNORMAL HIGH (ref 70–99)
Potassium: 4.3 mmol/L (ref 3.5–5.2)
Sodium: 136 mmol/L (ref 134–144)
Total Protein: 6.7 g/dL (ref 6.0–8.5)
eGFR: 105 mL/min/{1.73_m2} (ref >59)

## 2023-07-26 LAB — HEMOGLOBIN A1C
Est. average glucose Bld gHb Est-mCnc: 120 mg/dL
Hgb A1c MFr Bld: 5.8 % — ABNORMAL HIGH (ref 4.8–5.6)

## 2023-07-26 LAB — PROTEIN / CREATININE RATIO, URINE
Creatinine, Urine: 154.7 mg/dL
Protein, Ur: 11.9 mg/dL
Protein/Creat Ratio: 77 mg/g{creat} (ref 0–200)

## 2023-07-27 LAB — URINE CULTURE, OB REFLEX

## 2023-07-27 LAB — CULTURE, OB URINE

## 2023-08-04 ENCOUNTER — Encounter: Payer: Self-pay | Admitting: Obstetrics & Gynecology

## 2023-08-05 ENCOUNTER — Ambulatory Visit (INDEPENDENT_AMBULATORY_CARE_PROVIDER_SITE_OTHER): Payer: BC Managed Care – PPO | Admitting: Obstetrics and Gynecology

## 2023-08-05 ENCOUNTER — Other Ambulatory Visit (HOSPITAL_COMMUNITY)
Admission: RE | Admit: 2023-08-05 | Discharge: 2023-08-05 | Disposition: A | Discharge status: Home / Self Care | Payer: BC Managed Care – PPO | Source: Ambulatory Visit | Attending: Obstetrics and Gynecology | Admitting: Obstetrics and Gynecology

## 2023-08-05 VITALS — BP 131/85 | HR 105 | Wt 258.0 lb

## 2023-08-05 DIAGNOSIS — Z3A16 16 weeks gestation of pregnancy: Secondary | ICD-10-CM

## 2023-08-05 DIAGNOSIS — O26892 Other specified pregnancy related conditions, second trimester: Secondary | ICD-10-CM | POA: Diagnosis not present

## 2023-08-05 DIAGNOSIS — N898 Other specified noninflammatory disorders of vagina: Secondary | ICD-10-CM | POA: Diagnosis not present

## 2023-08-05 DIAGNOSIS — O24419 Gestational diabetes mellitus in pregnancy, unspecified control: Secondary | ICD-10-CM

## 2023-08-05 NOTE — Progress Notes (Signed)
   PRENATAL VISIT NOTE  Work In Visit for vaginal brown spotting since yesterday  Subjective:  Emily Banks is a 44 y.o. G2P0010 at [redacted]w[redacted]d being seen today for ongoing prenatal care.  She is currently monitored for the following issues for this high-risk pregnancy and has Anxiety and depression; Type 2 diabetes mellitus with hyperglycemia (HCC); Fatty liver; Newborn product of IVF pregnancy; BMI 40.0-44.9, adult (HCC); Obesity in pregnancy; Encounter for supervision of high-risk pregnancy with history of infertility in first trimester; History of borderline diabetes mellitus; and Primigravida of advanced maternal age in second trimester on their problem list.  Patient reports  above starting yesterday with slight cramps. Didn't seen any just now, asymptomatic with cramps .  Contractions: Not present. Vag. Bleeding: None.  Movement: Absent. Denies leaking of fluid.   The following portions of the patient's history were reviewed and updated as appropriate: allergies, current medications, past family history, past medical history, past social history, past surgical history and problem list.   Objective:   Vitals:   08/05/23 1337  BP: 131/85  Pulse: (!) 105  Weight: 258 lb (117 kg)    Fetal Status: Fetal Heart Rate (bpm): 155   Movement: Absent     General:  Alert, oriented and cooperative. Patient is in no acute distress.  Skin: Skin is warm and dry. No rash noted.   Cardiovascular: Normal heart rate noted  Respiratory: Normal respiratory effort, no problems with respiration noted  Abdomen: Soft, gravid, appropriate for gestational age.  Pain/Pressure: Present     Pelvic: Cervical exam performed in the presence of a chaperone  Spec exam done EGBUS wnl Vaginal vault: clumpy yellow-brown discharge, no blood Cervix: cl/long/high  Extremities: Normal range of motion.  Edema: None  Mental Status: Normal mood and affect. Normal behavior. Normal judgment and thought content.    Radiology: TAUS with SLIUP, subj normal AF, +FM, anterior placenta and no e/o abruption or [place] hemorrhage, bladder empty TVUS done with chaperone CL 3.5cm and ?LL placenta  Assessment and Plan:  Pregnancy: G2P0010 at [redacted]w[redacted]d 1. Vaginal discharge during pregnancy in second trimester (Primary) Reassuring assessment. Recommend pelvic rest for now - Cervicovaginal ancillary only( [place]) - AFP, Serum, Open Spina Bifida  2. [redacted] weeks gestation of pregnancy - AFP, Serum, Open Spina Bifida - Referral to Nutrition and Diabetes Services  3. GDM, class A2 AM Fastings in the 110s and 2h PP in the 150s on metformin  1000 bid. D/w her recommend DM education referral to start insulin  with recommendation to start nph 10 with breakfast and 10 qhs and see if she needs any short acting with meals.  Continue on metformin  - Referral to Nutrition and Diabetes Services  4. Newborn product of IVF pregnancy  Preterm labor symptoms and general obstetric precautions including but not limited to vaginal bleeding, contractions, leaking of fluid and fetal movement were reviewed in detail with the patient. Please refer to After Visit Summary for other counseling recommendations.   Return in 3 days (on 08/08/2023) for in person, high risk ob, md visit.  Future Appointments  Date Time Provider Department Center  08/08/2023  9:35 AM Anyanwu, Gloris LABOR, MD [facility]  09/04/2023 10:15 AM [facility] NURSE [facility]  09/04/2023 10:30 AM [facility] US1 [facility]    Bebe Furry, MD

## 2023-08-05 NOTE — Progress Notes (Signed)
 ROB   Worked into for vaginal bleeding. Notes Cramping and blood is brown.  No recent intercourse.

## 2023-08-06 ENCOUNTER — Encounter: Payer: Self-pay | Admitting: Obstetrics and Gynecology

## 2023-08-06 LAB — CERVICOVAGINAL ANCILLARY ONLY
Bacterial Vaginitis (gardnerella): NEGATIVE
Candida Glabrata: NEGATIVE
Candida Vaginitis: NEGATIVE
Comment: NEGATIVE
Comment: NEGATIVE
Comment: NEGATIVE

## 2023-08-07 ENCOUNTER — Other Ambulatory Visit: Payer: Self-pay | Admitting: Obstetrics and Gynecology

## 2023-08-07 LAB — AFP, SERUM, OPEN SPINA BIFIDA
AFP MoM: 1.24
AFP Value: 29.7 ng/mL
Gest. Age on Collection Date: 16 wk
Maternal Age At EDD: 43.2 a
OSBR Risk 1 IN: 5748
Test Results:: NEGATIVE
Weight: 258 [lb_av]

## 2023-08-07 MED ORDER — LEVOTHYROXINE SODIUM 50 MCG PO TABS: 50.0000 ug | ORAL_TABLET | Freq: Every day | ORAL | 0 refills | Status: DC

## 2023-08-08 ENCOUNTER — Ambulatory Visit (INDEPENDENT_AMBULATORY_CARE_PROVIDER_SITE_OTHER): Payer: BC Managed Care – PPO | Admitting: Obstetrics & Gynecology

## 2023-08-08 VITALS — BP 133/83 | HR 66 | Wt 260.0 lb

## 2023-08-08 DIAGNOSIS — O0992 Supervision of high risk pregnancy, unspecified, second trimester: Secondary | ICD-10-CM

## 2023-08-08 DIAGNOSIS — O24319 Unspecified pre-existing diabetes mellitus in pregnancy, unspecified trimester: Secondary | ICD-10-CM | POA: Insufficient documentation

## 2023-08-08 DIAGNOSIS — O24419 Gestational diabetes mellitus in pregnancy, unspecified control: Secondary | ICD-10-CM | POA: Insufficient documentation

## 2023-08-08 DIAGNOSIS — O99210 Obesity complicating pregnancy, unspecified trimester: Secondary | ICD-10-CM

## 2023-08-08 DIAGNOSIS — Z3A16 16 weeks gestation of pregnancy: Secondary | ICD-10-CM

## 2023-08-08 DIAGNOSIS — O24119 Pre-existing diabetes mellitus, type 2, in pregnancy, unspecified trimester: Secondary | ICD-10-CM | POA: Insufficient documentation

## 2023-08-08 DIAGNOSIS — O24414 Gestational diabetes mellitus in pregnancy, insulin controlled: Secondary | ICD-10-CM | POA: Insufficient documentation

## 2023-08-08 DIAGNOSIS — O09522 Supervision of elderly multigravida, second trimester: Secondary | ICD-10-CM

## 2023-08-08 DIAGNOSIS — O09812 Supervision of pregnancy resulting from assisted reproductive technology, second trimester: Secondary | ICD-10-CM

## 2023-08-08 HISTORY — DX: Gestational diabetes mellitus in pregnancy, unspecified control: O24.419

## 2023-08-08 MED ORDER — INSULIN DETEMIR 100 UNIT/ML ~~LOC~~ SOLN: 10.0000 [IU] | Freq: Every day | SUBCUTANEOUS | 11 refills | Status: DC

## 2023-08-08 MED ORDER — ASPIRIN 81 MG PO TBEC: 81.0000 mg | DELAYED_RELEASE_TABLET | Freq: Every day | ORAL | 2 refills | Status: DC

## 2023-08-08 MED ORDER — DEXCOM G7 SENSOR MISC: 3 refills | Status: DC

## 2023-08-08 MED ORDER — INSULIN SYRINGES (DISPOSABLE) U-100 0.5 ML MISC: 0 refills | Status: DC

## 2023-08-08 NOTE — Progress Notes (Signed)
   PRENATAL VISIT NOTE  Subjective:  Emily Banks is a 43 y.o. G2P0010 at [redacted]w[redacted]d being seen today for ongoing prenatal care.  She is currently monitored for the following issues for this high-risk pregnancy and has Anxiety and depression; Type 2 diabetes mellitus with hyperglycemia (HCC); Pregnancy resulting from in vitro fertilization; BMI 40.0-44.9, adult (HCC); Maternal morbid obesity, antepartum (HCC); Supervision of high-risk pregnancy; Advanced maternal age >60; and Preexisting diabetes complicating pregnancy, antepartum on their problem list.  Patient reports no complaints.  Contractions: Not present. Vag. Bleeding: None.  Movement: Present. Denies leaking of fluid.   The following portions of the patient's history were reviewed and updated as appropriate: allergies, current medications, past family history, past medical history, past social history, past surgical history and problem list.   Objective:   Vitals:   08/08/23 0938  BP: 133/83  Pulse: 66  Weight: 260 lb (117.9 kg)    Fetal Status: Fetal Heart Rate (bpm): 152   Movement: Present     General:  Alert, oriented and cooperative. Patient is in no acute distress.  Skin: Skin is warm and dry. No rash noted.   Cardiovascular: Normal heart rate noted  Respiratory: Normal respiratory effort, no problems with respiration noted  Abdomen: Soft, gravid, appropriate for gestational age.  Pain/Pressure: Absent     Pelvic: Cervical exam deferred        Extremities: Normal range of motion.  Edema: None  Mental Status: Normal mood and affect. Normal behavior. Normal judgment and thought content.   Assessment and Plan:  Pregnancy: G2P0010 at [redacted]w[redacted]d 1. Preexisting diabetes complicating pregnancy, antepartum (Primary) Reports fastings in 110s, PP 130-150s.  Discussed adding insulin, prescribed Levemir 10 units at bedtime for now (based on formulary in [employer]).  Also ordered Dexcom.  Already scheduled for anatomy scan.  - insulin  detemir (LEVEMIR) 100 UNIT/ML injection; Inject 0.1 mLs (10 Units total) into the skin at bedtime.  Dispense: 10 mL; Refill: 11 - Insulin Syringes, Disposable, U-100 0.5 ML MISC; Use as instructed with insulin  Dispense: 100 each; Refill: 0 - Continuous Glucose Sensor (DEXCOM G7 SENSOR) MISC; Use one sensor every 10 days as instructed  Dispense: 9 each; Refill: 3 - Ambulatory referral to Pediatric Cardiology for fetal ECHO - AMB Referral to Cardio Obstetrics - aspirin EC 81 MG tablet; Take 1 tablet (81 mg total) by mouth at bedtime. Start taking when you are [redacted] weeks pregnant for rest of pregnancy for prevention of preeclampsia  Dispense: 300 tablet; Refill: 2  2. Maternal morbid obesity, antepartum (HCC) PG BMI 42.  TWG -15 lbs. Will continue to monitor - AMB Referral to Cardio Obstetrics  3. Pregnancy resulting from in vitro fertilization in second trimester Will do scans and monitoring as per MFM  4. Multigravida of advanced maternal age in second trimester Will do scans and monitoring as per MFM  5. [redacted] weeks gestation of pregnancy 6. Supervision of high risk pregnancy in second trimester No other concerns. No other complaints or concerns.  Routine obstetric precautions reviewed. Please refer to After Visit Summary for other counseling recommendations.   Return in about 2 weeks (around 08/22/2023) for OFFICE OB VISIT (MD only), CBG review.  Future Appointments  Date Time Provider Department Center  08/22/2023  9:35 AM Tereso Newcomer, MD [facility]  09/04/2023 10:15 AM [facility] NURSE [facility]  09/04/2023 10:30 AM [facility] US1 [facility]    Jaynie Collins, MD

## 2023-08-09 ENCOUNTER — Encounter: Payer: Self-pay | Admitting: Obstetrics and Gynecology

## 2023-08-09 DIAGNOSIS — E039 Hypothyroidism, unspecified: Secondary | ICD-10-CM | POA: Insufficient documentation

## 2023-08-09 DIAGNOSIS — O99283 Endocrine, nutritional and metabolic diseases complicating pregnancy, third trimester: Secondary | ICD-10-CM | POA: Insufficient documentation

## 2023-08-15 ENCOUNTER — Other Ambulatory Visit (HOSPITAL_COMMUNITY): Payer: Self-pay

## 2023-08-22 ENCOUNTER — Encounter: Payer: Self-pay | Admitting: Obstetrics & Gynecology

## 2023-08-22 ENCOUNTER — Ambulatory Visit: Payer: BC Managed Care – PPO | Admitting: Obstetrics & Gynecology

## 2023-08-22 ENCOUNTER — Encounter: Payer: Self-pay | Admitting: *Deleted

## 2023-08-22 VITALS — BP 129/84 | HR 76 | Wt 261.0 lb

## 2023-08-22 DIAGNOSIS — O09522 Supervision of elderly multigravida, second trimester: Secondary | ICD-10-CM

## 2023-08-22 DIAGNOSIS — E039 Hypothyroidism, unspecified: Secondary | ICD-10-CM

## 2023-08-22 DIAGNOSIS — O09812 Supervision of pregnancy resulting from assisted reproductive technology, second trimester: Secondary | ICD-10-CM

## 2023-08-22 DIAGNOSIS — O99282 Endocrine, nutritional and metabolic diseases complicating pregnancy, second trimester: Secondary | ICD-10-CM

## 2023-08-22 DIAGNOSIS — Z3A18 18 weeks gestation of pregnancy: Secondary | ICD-10-CM

## 2023-08-22 DIAGNOSIS — O0992 Supervision of high risk pregnancy, unspecified, second trimester: Secondary | ICD-10-CM

## 2023-08-22 DIAGNOSIS — O99212 Obesity complicating pregnancy, second trimester: Secondary | ICD-10-CM

## 2023-08-22 DIAGNOSIS — O24112 Pre-existing diabetes mellitus, type 2, in pregnancy, second trimester: Secondary | ICD-10-CM

## 2023-08-22 MED ORDER — INSULIN DETEMIR 100 UNIT/ML ~~LOC~~ SOLN: 13.0000 [IU] | Freq: Every day | SUBCUTANEOUS | 2 refills | Status: DC

## 2023-08-22 NOTE — Progress Notes (Signed)
   PRENATAL VISIT NOTE  Subjective:  Emily Banks is a 43 y.o. G2P0010 at [redacted]w[redacted]d being seen today for ongoing prenatal care.  She is currently monitored for the following issues for this high-risk pregnancy and has Anxiety and depression; Pregnancy resulting from in vitro fertilization; BMI 40.0-44.9, adult (HCC); Maternal morbid obesity, antepartum (HCC); Supervision of high-risk pregnancy; Advanced maternal age >52; Type 2 diabetes mellitus in pregnancy; and Hypothyroid in pregnancy, antepartum, second trimester on their problem list.  Patient reports no complaints.  Contractions: Not present. Vag. Bleeding: None.  Movement: Present. Denies leaking of fluid.   The following portions of the patient's history were reviewed and updated as appropriate: allergies, current medications, past family history, past medical history, past social history, past surgical history and problem list.   Objective:   Vitals:   08/22/23 0945  BP: 129/84  Pulse: 76  Weight: 261 lb (118.4 kg)    Fetal Status: Fetal Heart Rate (bpm): 155   Movement: Present     General:  Alert, oriented and cooperative. Patient is in no acute distress.  Skin: Skin is warm and dry. No rash noted.   Cardiovascular: Normal heart rate noted  Respiratory: Normal respiratory effort, no problems with respiration noted  Abdomen: Soft, gravid, appropriate for gestational age.  Pain/Pressure: Absent     Pelvic: Cervical exam deferred        Extremities: Normal range of motion.  Edema: None  Mental Status: Normal mood and affect. Normal behavior. Normal judgment and thought content.   Assessment and Plan:  Pregnancy: G2P0010 at [redacted]w[redacted]d 1. Pregnancy with type 2 diabetes mellitus in second trimester (Primary) Elevated fastings in 100s-110s, some elvated PP. Increased Levemir to 13 units. Awaiting Dexcom approval by insurance, paperwork to be sent documenting that she is on insulin.   Anatomy ultrasound and fetal ECHO scheduled. -  insulin detemir (LEVEMIR) 100 UNIT/ML injection; Inject 0.13 mLs (13 Units total) into the skin at bedtime.  Dispense: 10 mL; Refill: 2  2. Maternal morbid obesity, antepartum (HCC) PG BMI 42. TWG -14 lbs. Will continue to monitor   3. Hypothyroid in pregnancy, antepartum, second trimester Stable labs in Jan 2025, on Synthroid 25 mcg daily. Will recheck labs next visit.  4. Pregnancy resulting from in vitro fertilization in second trimester Will do scans and monitoring as per MFM   5. Multigravida of advanced maternal age in second trimester Will do scans and monitoring as per MFM   6. [redacted] weeks gestation of pregnancy 7. Supervision of high risk pregnancy in second trimester No other concerns. No other complaints or concerns.  Routine obstetric precautions reviewed.   Please refer to After Visit Summary for other counseling recommendations.   Return in about 4 weeks (around 09/19/2023) for OFFICE OB VISIT (MD only) and lab.  Future Appointments  Date Time Provider Department Center  09/04/2023 10:15 AM [facility] NURSE [facility]  09/04/2023 10:30 AM [facility] US1 [facility]  09/18/2023  9:35 AM Reva Bores, MD [facility]    Jaynie Collins, MD

## 2023-09-04 ENCOUNTER — Other Ambulatory Visit: Payer: Self-pay | Admitting: *Deleted

## 2023-09-04 ENCOUNTER — Other Ambulatory Visit: Payer: Self-pay

## 2023-09-04 ENCOUNTER — Ambulatory Visit: Payer: BC Managed Care – PPO

## 2023-09-04 ENCOUNTER — Encounter: Payer: Self-pay | Admitting: *Deleted

## 2023-09-04 ENCOUNTER — Ambulatory Visit: Payer: BC Managed Care – PPO | Admitting: Maternal & Fetal Medicine

## 2023-09-04 ENCOUNTER — Ambulatory Visit: Payer: BC Managed Care – PPO | Attending: Obstetrics and Gynecology

## 2023-09-04 VITALS — BP 137/72 | HR 72

## 2023-09-04 DIAGNOSIS — O09522 Supervision of elderly multigravida, second trimester: Secondary | ICD-10-CM | POA: Diagnosis not present

## 2023-09-04 DIAGNOSIS — O0992 Supervision of high risk pregnancy, unspecified, second trimester: Secondary | ICD-10-CM

## 2023-09-04 DIAGNOSIS — O24112 Pre-existing diabetes mellitus, type 2, in pregnancy, second trimester: Secondary | ICD-10-CM | POA: Insufficient documentation

## 2023-09-04 DIAGNOSIS — O0901 Supervision of pregnancy with history of infertility, first trimester: Secondary | ICD-10-CM | POA: Diagnosis not present

## 2023-09-04 DIAGNOSIS — Z6841 Body Mass Index (BMI) 40.0 and over, adult: Secondary | ICD-10-CM | POA: Insufficient documentation

## 2023-09-04 DIAGNOSIS — O09812 Supervision of pregnancy resulting from assisted reproductive technology, second trimester: Secondary | ICD-10-CM | POA: Diagnosis not present

## 2023-09-04 DIAGNOSIS — O24414 Gestational diabetes mellitus in pregnancy, insulin controlled: Secondary | ICD-10-CM | POA: Diagnosis not present

## 2023-09-04 DIAGNOSIS — Z3A20 20 weeks gestation of pregnancy: Secondary | ICD-10-CM | POA: Insufficient documentation

## 2023-09-04 DIAGNOSIS — Z3A14 14 weeks gestation of pregnancy: Secondary | ICD-10-CM | POA: Insufficient documentation

## 2023-09-04 NOTE — Progress Notes (Signed)
Patient information  Patient Name: Emily Banks  Patient MRN:   [id number]  Referring practice: MFM Referring Provider: [facility]  MFM CONSULT  Emily Banks is a 43 y.o. G2P0010 at [redacted]w[redacted]d here for ultrasound and consultation. Patient Active Problem List   Diagnosis Date Noted   Hypothyroid in pregnancy, antepartum, second trimester 08/09/2023   Insulin controlled gestational diabetes mellitus in second trimester 08/08/2023   Pregnancy resulting from in vitro fertilization 07/25/2023   BMI 40.0-44.9, adult (HCC) 07/25/2023   Maternal morbid obesity, antepartum (HCC) 07/25/2023   Supervision of high-risk pregnancy 07/25/2023   Advanced maternal age >40 07/25/2023   Anxiety and depression 01/31/2016   Emily Banks is doing well today with no acute concerns.    RE IVF: Embryo transfer of a euploid embryo via PGT-A (female) with her egg and husbands sperm. The believed cause of infertility was PCOS.  She would like NIPT testing to be performed in the event that there was mosaicism after the PGT testing.  I discussed the potential adverse obstetric and fetal associations associated with IVF in pregnancy.  There are thought to be increased risks of growth abnormalities, preeclampsia, gestational diabetes, operative delivery, need for early delivery and preterm birth.  We discussed the role of antenatal testing and serial growth ultrasounds.  Also discussed there is a slightly high risk of congenital heart disease in some studies and a fetal echo was offered.  The patient would like to have this scheduled.  RE early onset gestational diabetes: A1c was 5.5 but her fasting blood sugars were in the low 100s. She is currently on 13 units of Lantus and 1000mg  of Metformin.  I discussed that type 2 diabetes cannot be reliably diagnosed during pregnancy and her diagnosis should be considered early onset gestational diabetes.  She would need to rule out type 2  diabetes after she is no longer pregnant.  We discussed the importance of proper glycemic control to minimize the risk of growth abnormalities and stillbirth.  RE AMA: Since this is an IVF pregnancy the risk of aneuploidy is not based on the patient's age.  The potential obstetric complications with age over 53 are similar to that of gestational diabetes and IVF pregnancy.  RE elevated BMI: This can complicate nearly every aspect of pregnancy care but I reassured the patient that overall pregnancies are successful in the setting of elevated BMI.  Sonographic findings Single intrauterine pregnancy at 20w 5d  Fetal cardiac activity:  Observed and appears normal. Presentation: Cephalic. The anatomic structures that were well seen appear normal without evidence of soft markers. Due to poor acoustic windows some structures remain suboptimally visualized. Fetal biometry shows the estimated fetal weight at the 77 percentile.  Amniotic fluid: Within normal limits.  MVP:  cm. Placenta: Anterior. Adnexa: No abnormality visualized. Cervical length: 4.2 cm.  There are limitations of prenatal ultrasound such as the inability to detect certain abnormalities due to poor visualization. Various factors such as fetal position, gestational age and maternal body habitus may increase the difficulty in visualizing the fetal anatomy.    Recommendations -EDD should be 01/17/2024 based on  Embryo Transfer. -Follow up ultrasound in 4-6 weeks to attempt visualization of the anatomy not seen and reassess the fetal growth -Baseline preeclampsia labs: CMP, CBC and urine protein/creatinine ratio if not previously completed.  -Continue glucose assessment and increase insulin by 2 units every night that her blood sugars are poorly controlled.  I also discussed hypoglycemic precautions. -Continue  Aspirin 81 mg for preeclampsia prophylaxis -Follow-up anatomy and fetal growth in 4 to 6 weeks -Fetal echo arranged due to  IVF -Serial growth ultrasounds starting around 28 weeks to monitor for fetal growth restriction -Antenatal testing to start around 32 weeks due to the increased risk of stillbirth and high risk pregnancy -Delivery timing pending clinical course but likely around [redacted] weeks gestation -Continue routine prenatal care with referring OB provider  Review of Systems: A review of systems was performed and was negative except per HPI   Vitals and Physical Exam    09/04/2023   10:07 AM 08/22/2023    9:45 AM 08/08/2023    9:38 AM  Vitals with BMI  Weight  261 lbs 260 lbs  Systolic 137 129 161  Diastolic 72 84 83  Pulse 72 76 66  Sitting comfortably on the sonogram table Nonlabored breathing Normal rate and rhythm Abdomen is nontender  Past pregnancies OB History  Gravida Para Term Preterm AB Living  2    1   SAB IAB Ectopic Multiple Live Births    1      # Outcome Date GA Lbr Len/2nd Weight Sex Type Anes PTL Lv  2 Current           1 Ectopic             I spent 60 minutes reviewing the patients chart, including labs and images as well as counseling the patient about her medical conditions. Greater than 50% of the time was spent in direct face-to-face patient counseling.  Braxton Feathers  MFM, [place]   09/04/2023  1:15 PM

## 2023-09-06 ENCOUNTER — Other Ambulatory Visit: Payer: Self-pay | Admitting: *Deleted

## 2023-09-06 DIAGNOSIS — O99212 Obesity complicating pregnancy, second trimester: Secondary | ICD-10-CM

## 2023-09-06 DIAGNOSIS — O09812 Supervision of pregnancy resulting from assisted reproductive technology, second trimester: Secondary | ICD-10-CM

## 2023-09-06 DIAGNOSIS — O09522 Supervision of elderly multigravida, second trimester: Secondary | ICD-10-CM

## 2023-09-10 ENCOUNTER — Telehealth: Payer: Self-pay

## 2023-09-10 LAB — PANORAMA PRENATAL TEST FULL PANEL:PANORAMA TEST PLUS 5 ADDITIONAL MICRODELETIONS: FETAL FRACTION: 6.1

## 2023-09-10 NOTE — Telephone Encounter (Signed)
 I spoke with the patient to return her recent Panorama cell-free DNA (cfDNA) screening results. The result is low risk, consistent with a female fetus. This screening significantly reduces but does not eliminate the chance that the current pregnancy has Down syndrome (trisomy 60), trisomy 66, trisomy 42, and common sex chromosome conditions. Please see report for details. Additionally, there are many genetic conditions that cannot be detected by cfDNA.    Sheppard Plumber, MS Genetic Counselor [facility] [phone]

## 2023-09-12 ENCOUNTER — Other Ambulatory Visit: Payer: Self-pay | Admitting: Obstetrics & Gynecology

## 2023-09-12 DIAGNOSIS — O24319 Unspecified pre-existing diabetes mellitus in pregnancy, unspecified trimester: Secondary | ICD-10-CM

## 2023-09-12 MED ORDER — INSULIN SYRINGES (DISPOSABLE) U-100 0.5 ML MISC: 0 refills | Status: DC

## 2023-09-17 ENCOUNTER — Other Ambulatory Visit: Payer: Self-pay | Admitting: Obstetrics and Gynecology

## 2023-09-18 ENCOUNTER — Ambulatory Visit: Payer: BC Managed Care – PPO | Admitting: Family Medicine

## 2023-09-18 VITALS — BP 127/79 | HR 80 | Wt 259.0 lb

## 2023-09-18 DIAGNOSIS — O24414 Gestational diabetes mellitus in pregnancy, insulin controlled: Secondary | ICD-10-CM

## 2023-09-18 DIAGNOSIS — Z3A22 22 weeks gestation of pregnancy: Secondary | ICD-10-CM

## 2023-09-18 DIAGNOSIS — Z23 Encounter for immunization: Secondary | ICD-10-CM | POA: Diagnosis not present

## 2023-09-18 DIAGNOSIS — O99282 Endocrine, nutritional and metabolic diseases complicating pregnancy, second trimester: Secondary | ICD-10-CM

## 2023-09-18 DIAGNOSIS — E039 Hypothyroidism, unspecified: Secondary | ICD-10-CM

## 2023-09-18 DIAGNOSIS — O09522 Supervision of elderly multigravida, second trimester: Secondary | ICD-10-CM | POA: Diagnosis not present

## 2023-09-18 DIAGNOSIS — O0992 Supervision of high risk pregnancy, unspecified, second trimester: Secondary | ICD-10-CM | POA: Diagnosis not present

## 2023-09-18 NOTE — Progress Notes (Signed)
   PRENATAL VISIT NOTE  Subjective:  Emily Banks is a 43 y.o. G2P0010 at [redacted]w[redacted]d being seen today for ongoing prenatal care.  She is currently monitored for the following issues for this low-risk pregnancy and has Anxiety and depression; Pregnancy resulting from in vitro fertilization; BMI 40.0-44.9, adult (HCC); Maternal morbid obesity, antepartum (HCC); Supervision of high-risk pregnancy; Advanced maternal age >67; Insulin controlled gestational diabetes mellitus in second trimester; and Hypothyroid in pregnancy, antepartum, second trimester on their problem list.  Patient reports no complaints.  Contractions: Not present. Vag. Bleeding: None.  Movement: Present. Denies leaking of fluid.   The following portions of the patient's history were reviewed and updated as appropriate: allergies, current medications, past family history, past medical history, past social history, past surgical history and problem list.   Objective:   Vitals:   09/18/23 0951  BP: 127/79  Pulse: 80  Weight: 259 lb (117.5 kg)    Fetal Status: Fetal Heart Rate (bpm): 154 Fundal Height: 24 cm Movement: Present     General:  Alert, oriented and cooperative. Patient is in no acute distress.  Skin: Skin is warm and dry. No rash noted.   Cardiovascular: Normal heart rate noted  Respiratory: Normal respiratory effort, no problems with respiration noted  Abdomen: Soft, gravid, appropriate for gestational age.  Pain/Pressure: Present     Pelvic: Cervical exam deferred        Extremities: Normal range of motion.  Edema: None  Mental Status: Normal mood and affect. Normal behavior. Normal judgment and thought content.   Assessment and Plan:  Pregnancy: G2P0010 at [redacted]w[redacted]d 1. Insulin controlled gestational diabetes mellitus in second trimester (Primary)  Continue her metformin 1000 mg bid and lantus, currently @ 13 u, increased to 14 but dexcom kept alarming all night--will increase as needed.   2. Hypothyroid in  pregnancy, antepartum, second trimester On synthroid Repeat TSH  3. Supervision of high risk pregnancy in second trimester - Flu vaccine trivalent PF, 6mos and older(Flulaval,Afluria,Fluarix,Fluzone)  4. Multigravida of advanced maternal age in second trimester LR NIPT  Preterm labor symptoms and general obstetric precautions including but not limited to vaginal bleeding, contractions, leaking of fluid and fetal movement were reviewed in detail with the patient. Please refer to After Visit Summary for other counseling recommendations.   Return in 3 weeks (on 10/09/2023).  Future Appointments  Date Time Provider Department Center  10/03/2023  9:15 AM [facility] NURSE [facility]  10/03/2023  9:30 AM [facility] US1 [facility]  10/09/2023 10:55 AM Reva Bores, MD [facility]  10/30/2023  9:15 AM [facility] NURSE [facility]  10/30/2023  9:30 AM [facility] US5 [facility]    Reva Bores, MD

## 2023-09-18 NOTE — Progress Notes (Signed)
 ROB   CC: pt is concerned about Dexcom readings pt has blood sugar log.

## 2023-09-19 ENCOUNTER — Encounter: Payer: Self-pay | Admitting: *Deleted

## 2023-09-30 ENCOUNTER — Telehealth: Payer: Self-pay

## 2023-09-30 NOTE — Telephone Encounter (Signed)
 Referral status/update

## 2023-10-03 ENCOUNTER — Ambulatory Visit: Payer: BC Managed Care – PPO | Attending: Maternal & Fetal Medicine

## 2023-10-03 ENCOUNTER — Ambulatory Visit: Payer: BC Managed Care – PPO | Admitting: *Deleted

## 2023-10-03 ENCOUNTER — Other Ambulatory Visit: Payer: Self-pay | Admitting: *Deleted

## 2023-10-03 ENCOUNTER — Ambulatory Visit: Admitting: Maternal & Fetal Medicine

## 2023-10-03 ENCOUNTER — Other Ambulatory Visit: Payer: Self-pay

## 2023-10-03 VITALS — BP 140/76 | HR 73

## 2023-10-03 DIAGNOSIS — O09523 Supervision of elderly multigravida, third trimester: Secondary | ICD-10-CM | POA: Diagnosis not present

## 2023-10-03 DIAGNOSIS — O99212 Obesity complicating pregnancy, second trimester: Secondary | ICD-10-CM | POA: Diagnosis not present

## 2023-10-03 DIAGNOSIS — O24414 Gestational diabetes mellitus in pregnancy, insulin controlled: Secondary | ICD-10-CM | POA: Diagnosis not present

## 2023-10-03 DIAGNOSIS — O09522 Supervision of elderly multigravida, second trimester: Secondary | ICD-10-CM

## 2023-10-03 DIAGNOSIS — O26892 Other specified pregnancy related conditions, second trimester: Secondary | ICD-10-CM | POA: Diagnosis not present

## 2023-10-03 DIAGNOSIS — R030 Elevated blood-pressure reading, without diagnosis of hypertension: Secondary | ICD-10-CM

## 2023-10-03 DIAGNOSIS — E669 Obesity, unspecified: Secondary | ICD-10-CM | POA: Diagnosis not present

## 2023-10-03 DIAGNOSIS — O09812 Supervision of pregnancy resulting from assisted reproductive technology, second trimester: Secondary | ICD-10-CM | POA: Diagnosis not present

## 2023-10-03 DIAGNOSIS — Z3A24 24 weeks gestation of pregnancy: Secondary | ICD-10-CM | POA: Insufficient documentation

## 2023-10-03 DIAGNOSIS — O24410 Gestational diabetes mellitus in pregnancy, diet controlled: Secondary | ICD-10-CM

## 2023-10-03 DIAGNOSIS — E039 Hypothyroidism, unspecified: Secondary | ICD-10-CM

## 2023-10-03 DIAGNOSIS — Z3A25 25 weeks gestation of pregnancy: Secondary | ICD-10-CM

## 2023-10-03 DIAGNOSIS — O99282 Endocrine, nutritional and metabolic diseases complicating pregnancy, second trimester: Secondary | ICD-10-CM

## 2023-10-03 NOTE — Progress Notes (Addendum)
 Patient information  Patient Name: Emily Banks  Patient MRN:   [id number]  Referring practice: MFM Referring Provider: [facility]  MFM CONSULT  JAYLON GRODE is a 43 y.o. G2P0010 at [redacted]w[redacted]d here for ultrasound and consultation. Patient Active Problem List   Diagnosis Date Noted   Hypothyroid in pregnancy, antepartum, second trimester 08/09/2023   Insulin controlled gestational diabetes mellitus in second trimester 08/08/2023   Pregnancy resulting from in vitro fertilization 07/25/2023   BMI 40.0-44.9, adult (HCC) 07/25/2023   Maternal morbid obesity, antepartum (HCC) 07/25/2023   Supervision of high-risk pregnancy 07/25/2023   Advanced maternal age >40 07/25/2023   Anxiety and depression 01/31/2016   Emily Banks is doing well today with no acute concerns.  Her pregnancy is complicated by hypothyroidism, IVF, advanced maternal age and gestational diabetes on insulin.  Today we addressed the following concerns:  RE gestational diabetes on insulin: Fasting blood sugars are between 90 and 98.  Her 2-hour postprandials range between 120-130.  Currently she takes 14 units of Levemir at night.  She also takes metformin at 1000 mg twice a day I instructed her to increase her insulin by 2 units every time her fasting blood sugar is not at goal.  RE elevated blood pressure: The initial read today was 140/76 with a repeat of 133/70.  Today she denies headaches or vision changes or right upper quadrant pain.  We discussed that her repeat blood pressure was normal and that should be the blood pressure excepted today.  We discussed the concern of elevated blood pressure in pregnancy eclampsia.  She will also use her husband's blood pressure cuff at home and call her doctor if she has any concerns.  I discussed the blood pressure parameters that are acceptable during pregnancy.  Sonographic findings Single intrauterine pregnancy at 25w 0d.  Fetal cardiac activity:   Observed and appears normal. Presentation: Cephalic. Interval fetal anatomy appears normal. Fetal biometry shows the estimated fetal weight at the 73 percentile. Amniotic fluid volume: Within normal limits. MVP: 4.78 cm. Placenta: Anterior.  Recommendations 1. Serial growth ultrasounds every 4-6 weeks until delivery 2. Antenatal testing to start around 32 weeks  3.  Continue glucose assessment.  Increase Lantus by 2 units every day her fasting blood sugars are not at goal.  Discussed hypoglycemic precautions. 4.  At home blood pressure to be used to assess for elevated pressures greater than 140/90.  Patient will call the OB provider concerns  Review of Systems: A review of systems was performed and was negative except per HPI   Vitals and Physical Exam    10/03/2023   10:56 AM 10/03/2023    9:20 AM 09/18/2023    9:51 AM  Vitals with BMI  Weight   259 lbs  Systolic 133 140 213  Diastolic 70 76 79  Pulse  73 80    Sitting comfortably on the sonogram table Nonlabored breathing Normal rate and rhythm Abdomen is nontender  Past pregnancies OB History  Gravida Para Term Preterm AB Living  2    1   SAB IAB Ectopic Multiple Live Births    1      # Outcome Date GA Lbr Len/2nd Weight Sex Type Anes PTL Lv  2 Current           1 Ectopic              I spent 30 minutes reviewing the patients chart, including labs and images as well as  counseling the patient about her medical conditions. Greater than 50% of the time was spent in direct face-to-face patient counseling.  Braxton Feathers  MFM, [place]   10/03/2023  2:50 PM

## 2023-10-06 ENCOUNTER — Other Ambulatory Visit: Payer: Self-pay | Admitting: Obstetrics and Gynecology

## 2023-10-07 MED ORDER — LEVOTHYROXINE SODIUM 50 MCG PO TABS: 50.0000 ug | ORAL_TABLET | Freq: Every day | ORAL | 0 refills | Status: DC

## 2023-10-09 ENCOUNTER — Ambulatory Visit (INDEPENDENT_AMBULATORY_CARE_PROVIDER_SITE_OTHER): Payer: BC Managed Care – PPO | Admitting: Family Medicine

## 2023-10-09 VITALS — BP 126/73 | HR 79 | Wt 257.8 lb

## 2023-10-09 DIAGNOSIS — Z6841 Body Mass Index (BMI) 40.0 and over, adult: Secondary | ICD-10-CM

## 2023-10-09 DIAGNOSIS — O09522 Supervision of elderly multigravida, second trimester: Secondary | ICD-10-CM

## 2023-10-09 DIAGNOSIS — E039 Hypothyroidism, unspecified: Secondary | ICD-10-CM

## 2023-10-09 DIAGNOSIS — O0992 Supervision of high risk pregnancy, unspecified, second trimester: Secondary | ICD-10-CM

## 2023-10-09 DIAGNOSIS — O99282 Endocrine, nutritional and metabolic diseases complicating pregnancy, second trimester: Secondary | ICD-10-CM

## 2023-10-09 DIAGNOSIS — O24414 Gestational diabetes mellitus in pregnancy, insulin controlled: Secondary | ICD-10-CM

## 2023-10-09 DIAGNOSIS — O09812 Supervision of pregnancy resulting from assisted reproductive technology, second trimester: Secondary | ICD-10-CM

## 2023-10-09 DIAGNOSIS — Z3A25 25 weeks gestation of pregnancy: Secondary | ICD-10-CM

## 2023-10-09 NOTE — Progress Notes (Signed)
 ROB: declines any concerns   Increased insulin to 15 units and fasting glucose this am was 94

## 2023-10-09 NOTE — Patient Instructions (Signed)
 [email or web address]

## 2023-10-09 NOTE — Progress Notes (Signed)
 PRENATAL VISIT NOTE  Subjective:  Emily Banks is a 43 y.o. G2P0010 at [redacted]w[redacted]d being seen today for ongoing prenatal care.  She is currently monitored for the following issues for this high-risk pregnancy and has Anxiety and depression; Pregnancy resulting from in vitro fertilization; BMI 40.0-44.9, adult (HCC); Maternal morbid obesity, antepartum (HCC); Supervision of high-risk pregnancy; Advanced maternal age >17; Insulin controlled gestational diabetes mellitus in second trimester; and Hypothyroid in pregnancy, antepartum, second trimester on their problem list.  Patient reports no complaints.  Contractions: Not present. Vag. Bleeding: None.  Movement: Present. Denies leaking of fluid.   The following portions of the patient's history were reviewed and updated as appropriate: allergies, current medications, past family history, past medical history, past social history, past surgical history and problem list.   Objective:   Vitals:   10/09/23 1111  BP: 126/73  Pulse: 79  Weight: 257 lb 12.8 oz (116.9 kg)    Fetal Status: Fetal Heart Rate (bpm): 149   Movement: Present     General:  Alert, oriented and cooperative. Patient is in no acute distress.  Skin: Skin is warm and dry. No rash noted.   Cardiovascular: Normal heart rate noted  Respiratory: Normal respiratory effort, no problems with respiration noted  Abdomen: Soft, gravid, appropriate for gestational age.  Pain/Pressure: Absent     Pelvic: Cervical exam deferred        Extremities: Normal range of motion.  Edema: None  Mental Status: Normal mood and affect. Normal behavior. Normal judgment and thought content.   Assessment and Plan:  Pregnancy: G2P0010 at [redacted]w[redacted]d 1. Insulin controlled gestational diabetes mellitus in second trimester (Primary) Reports on Metformin 1000 mg bid, Lantus 15 u q hs Fastings are < 95, 2 hour pp 120-130, can consider split day dosing of Lantus if needed. Will likely need more as time  passes. Serial u/s for growth  2. Hypothyroid in pregnancy, antepartum, second trimester TFT's with next visit.  3. Supervision of high risk pregnancy in second trimester   4. Pregnancy resulting from in vitro fertilization in second trimester   5. BMI 40.0-44.9, adult (HCC)   6. Multigravida of advanced maternal age in second trimester LR genetics   7. [redacted] weeks gestation of pregnancy   Preterm labor symptoms and general obstetric precautions including but not limited to vaginal bleeding, contractions, leaking of fluid and fetal movement were reviewed in detail with the patient. Please refer to After Visit Summary for other counseling recommendations.   Return in 3 weeks (on 10/30/2023) for [facility].  Future Appointments  Date Time Provider Department Center  10/30/2023  9:15 AM [facility] NURSE [facility]  10/30/2023  9:30 AM [facility] US5 [facility]  10/31/2023  8:30 AM [facility] LAB [facility]  10/31/2023  8:55 AM Alfarata Bing, MD [facility]  11/13/2023 11:15 AM Reva Bores, MD [facility]  11/28/2023  9:15 AM [facility] NURSE [facility]  11/28/2023  9:30 AM [facility] US5 [facility]  11/28/2023 10:45 AM [facility] NST [facility]  11/28/2023  1:30 PM Anyanwu, Jethro Bastos, MD [facility]  12/05/2023  9:15 AM [facility] NURSE [facility]  12/05/2023  9:30 AM [facility] US5 [facility]  12/05/2023 10:45 AM [facility] NST [facility]  12/12/2023  9:15 AM [facility] NURSE [facility]  12/12/2023  9:30 AM [facility] US5 [facility]  12/12/2023 10:45 AM [facility] NST [facility]  12/12/2023  1:30 PM Warren-Hill, Clarita Crane, CNM [facility]  12/19/2023  9:15 AM [facility] NURSE [facility]  12/19/2023  9:30 AM [facility] US5 [facility]  12/19/2023 10:45 AM [facility] NST [facility]  12/26/2023  9:15 AM [facility] NURSE [facility]  12/26/2023  9:30 AM [facility] US5 [facility]  12/26/2023 10:45 AM [facility] NST [facility]    Reva Bores, MD

## 2023-10-17 ENCOUNTER — Encounter: Payer: Self-pay | Admitting: Family Medicine

## 2023-10-17 ENCOUNTER — Other Ambulatory Visit: Payer: Self-pay

## 2023-10-17 DIAGNOSIS — O24112 Pre-existing diabetes mellitus, type 2, in pregnancy, second trimester: Secondary | ICD-10-CM

## 2023-10-17 MED ORDER — INSULIN DETEMIR 100 UNIT/ML ~~LOC~~ SOLN: 15.0000 [IU] | Freq: Every day | SUBCUTANEOUS | 2 refills | Status: DC

## 2023-10-22 ENCOUNTER — Other Ambulatory Visit: Payer: Self-pay | Admitting: *Deleted

## 2023-10-22 DIAGNOSIS — O24112 Pre-existing diabetes mellitus, type 2, in pregnancy, second trimester: Secondary | ICD-10-CM

## 2023-10-22 MED ORDER — INSULIN DETEMIR 100 UNIT/ML ~~LOC~~ SOLN: 20.0000 [IU] | Freq: Every day | SUBCUTANEOUS | 1 refills | Status: DC

## 2023-10-24 ENCOUNTER — Other Ambulatory Visit: Payer: Self-pay | Admitting: *Deleted

## 2023-10-24 MED ORDER — INSULIN GLARGINE 100 UNIT/ML ~~LOC~~ SOLN: 20.0000 [IU] | Freq: Every day | SUBCUTANEOUS | 11 refills | Status: DC

## 2023-10-24 NOTE — Progress Notes (Signed)
 Levemir no longer manufactured

## 2023-10-30 ENCOUNTER — Ambulatory Visit: Payer: BC Managed Care – PPO | Attending: Maternal & Fetal Medicine

## 2023-10-30 ENCOUNTER — Ambulatory Visit: Payer: BC Managed Care – PPO

## 2023-10-30 ENCOUNTER — Other Ambulatory Visit: Payer: Self-pay

## 2023-10-30 ENCOUNTER — Ambulatory Visit: Admitting: Obstetrics

## 2023-10-30 VITALS — BP 127/70 | HR 83

## 2023-10-30 DIAGNOSIS — O09523 Supervision of elderly multigravida, third trimester: Secondary | ICD-10-CM | POA: Insufficient documentation

## 2023-10-30 DIAGNOSIS — O99213 Obesity complicating pregnancy, third trimester: Secondary | ICD-10-CM

## 2023-10-30 DIAGNOSIS — O09813 Supervision of pregnancy resulting from assisted reproductive technology, third trimester: Secondary | ICD-10-CM | POA: Diagnosis not present

## 2023-10-30 DIAGNOSIS — Z3A28 28 weeks gestation of pregnancy: Secondary | ICD-10-CM | POA: Diagnosis not present

## 2023-10-30 DIAGNOSIS — O99212 Obesity complicating pregnancy, second trimester: Secondary | ICD-10-CM | POA: Insufficient documentation

## 2023-10-30 DIAGNOSIS — O0993 Supervision of high risk pregnancy, unspecified, third trimester: Secondary | ICD-10-CM | POA: Diagnosis not present

## 2023-10-30 DIAGNOSIS — E039 Hypothyroidism, unspecified: Secondary | ICD-10-CM | POA: Diagnosis not present

## 2023-10-30 DIAGNOSIS — O99282 Endocrine, nutritional and metabolic diseases complicating pregnancy, second trimester: Secondary | ICD-10-CM | POA: Insufficient documentation

## 2023-10-30 DIAGNOSIS — O24414 Gestational diabetes mellitus in pregnancy, insulin controlled: Secondary | ICD-10-CM | POA: Insufficient documentation

## 2023-10-30 DIAGNOSIS — O99210 Obesity complicating pregnancy, unspecified trimester: Secondary | ICD-10-CM | POA: Insufficient documentation

## 2023-10-30 DIAGNOSIS — E669 Obesity, unspecified: Secondary | ICD-10-CM | POA: Diagnosis not present

## 2023-10-30 DIAGNOSIS — O09812 Supervision of pregnancy resulting from assisted reproductive technology, second trimester: Secondary | ICD-10-CM | POA: Diagnosis not present

## 2023-10-30 DIAGNOSIS — O09522 Supervision of elderly multigravida, second trimester: Secondary | ICD-10-CM | POA: Insufficient documentation

## 2023-10-30 DIAGNOSIS — F32A Depression, unspecified: Secondary | ICD-10-CM

## 2023-10-30 NOTE — Progress Notes (Signed)
 MFM Consult Note  Emily Banks is currently at 28 weeks and 5 days.  She has been followed due to advanced maternal age (43 years old), IVF pregnancy, maternal obesity, and gestational diabetes currently treated with insulin and metformin.  She reports that her fasting fingerstick values remain elevated in the 95-100s range.  On today's exam, the overall EFW of 3 pounds 6 ounces measures at the 86 percentile for her gestational age.    There was normal amniotic fluid noted with a total AFI of 11.5 cm.  As her fasting fingerstick values are elevated, she was advised to increase the dosage of her long-acting (Levemir) insulin from 17 units to 19 units at bedtime.  Due to advanced maternal age and her underlying medical conditions, we will start weekly fetal testing at 32 weeks.    She will return in 4 weeks for a growth scan and BPP.  A total of 20 minutes was spent counseling and coordinating the care for this patient.  Greater than 50% of the time was spent in direct face-to-face contact.

## 2023-10-31 ENCOUNTER — Ambulatory Visit: Admitting: Obstetrics and Gynecology

## 2023-10-31 ENCOUNTER — Other Ambulatory Visit

## 2023-10-31 VITALS — BP 121/82 | HR 78 | Wt 257.0 lb

## 2023-10-31 DIAGNOSIS — O99210 Obesity complicating pregnancy, unspecified trimester: Secondary | ICD-10-CM

## 2023-10-31 DIAGNOSIS — O24414 Gestational diabetes mellitus in pregnancy, insulin controlled: Secondary | ICD-10-CM

## 2023-10-31 DIAGNOSIS — Z1339 Encounter for screening examination for other mental health and behavioral disorders: Secondary | ICD-10-CM | POA: Diagnosis not present

## 2023-10-31 DIAGNOSIS — Z6841 Body Mass Index (BMI) 40.0 and over, adult: Secondary | ICD-10-CM

## 2023-10-31 DIAGNOSIS — O09813 Supervision of pregnancy resulting from assisted reproductive technology, third trimester: Secondary | ICD-10-CM

## 2023-10-31 DIAGNOSIS — O24112 Pre-existing diabetes mellitus, type 2, in pregnancy, second trimester: Secondary | ICD-10-CM

## 2023-10-31 DIAGNOSIS — O0992 Supervision of high risk pregnancy, unspecified, second trimester: Secondary | ICD-10-CM | POA: Diagnosis not present

## 2023-10-31 DIAGNOSIS — O0993 Supervision of high risk pregnancy, unspecified, third trimester: Secondary | ICD-10-CM

## 2023-10-31 DIAGNOSIS — Z3A28 28 weeks gestation of pregnancy: Secondary | ICD-10-CM

## 2023-10-31 DIAGNOSIS — O99282 Endocrine, nutritional and metabolic diseases complicating pregnancy, second trimester: Secondary | ICD-10-CM | POA: Diagnosis not present

## 2023-10-31 DIAGNOSIS — O09523 Supervision of elderly multigravida, third trimester: Secondary | ICD-10-CM | POA: Diagnosis not present

## 2023-10-31 DIAGNOSIS — O99019 Anemia complicating pregnancy, unspecified trimester: Secondary | ICD-10-CM | POA: Diagnosis not present

## 2023-10-31 DIAGNOSIS — E039 Hypothyroidism, unspecified: Secondary | ICD-10-CM | POA: Diagnosis not present

## 2023-10-31 MED ORDER — SERTRALINE HCL 100 MG PO TABS
100.0000 mg | ORAL_TABLET | Freq: Every day | ORAL | 0 refills | Status: DC
Start: 2023-10-31 — End: 2024-01-30

## 2023-10-31 MED ORDER — INSULIN DETEMIR 100 UNIT/ML ~~LOC~~ SOLN: 19.0000 [IU] | Freq: Every day | SUBCUTANEOUS | Status: DC

## 2023-10-31 NOTE — Telephone Encounter (Signed)
 Patient is due for CPE/follow up in late April, this will be required prior to any further refills.  Please schedule, thank you!

## 2023-10-31 NOTE — Progress Notes (Signed)
 ROB   CC: None

## 2023-10-31 NOTE — Telephone Encounter (Signed)
 Lvmtcb, sent mychart message

## 2023-10-31 NOTE — Progress Notes (Signed)
   PRENATAL VISIT NOTE  Subjective:  Emily Banks is a 43 y.o. G2P0010 at [redacted]w[redacted]d being seen today for ongoing prenatal care.  She is currently monitored for the following issues for this high-risk pregnancy and has Anxiety and depression; Pregnancy resulting from in vitro fertilization; BMI 40.0-44.9, adult (HCC); Maternal morbid obesity, antepartum (HCC); Supervision of high-risk pregnancy; Advanced maternal age >72; Insulin controlled gestational diabetes mellitus in second trimester; and Hypothyroid in pregnancy, antepartum, second trimester on their problem list.  Patient reports no complaints.  Contractions: Not present. Vag. Bleeding: None.  Movement: Present. Denies leaking of fluid.   The following portions of the patient's history were reviewed and updated as appropriate: allergies, current medications, past family history, past medical history, past social history, past surgical history and problem list.   Objective:   Vitals:   10/31/23 0844  BP: 121/82  Pulse: 78  Weight: 257 lb (116.6 kg)    Fetal Status: Fetal Heart Rate (bpm): 154   Movement: Present     General:  Alert, oriented and cooperative. Patient is in no acute distress.  Skin: Skin is warm and dry. No rash noted.   Cardiovascular: Normal heart rate noted  Respiratory: Normal respiratory effort, no problems with respiration noted  Abdomen: Soft, gravid, appropriate for gestational age.  Pain/Pressure: Absent     Pelvic: Cervical exam deferred        Extremities: Normal range of motion.  Edema: None  Mental Status: Normal mood and affect. Normal behavior. Normal judgment and thought content.   Assessment and Plan:  Pregnancy: G2P0010 at [redacted]w[redacted]d 1. Pregnancy with type 2 diabetes mellitus in second trimester (Primary) Patient increased yesterday by MFM from 17 to 19u at bedtime of levemir for high 90s to low 100s. AM today better and patient doesn't snack. Post prandial numbers wnl and patient with dexcom in  place. Recommend post prandial walks Start qwk testing at 32wks. D/w her likely 37/0-38/6 delivery timing 4/9: 86%, 1541g, ac 92%, afi 11.5 F/u 4/25 fetal echo  2. Hypothyroid in pregnancy, antepartum, second trimester On synthroid 50 qday. F/u TFTs today  3. [redacted] weeks gestation of pregnancy  4. Multigravida of advanced maternal age in third trimester No issues  5. BMI 40.0-44.9, adult (HCC) Weight stable  6. Maternal morbid obesity, antepartum (HCC)  7. Pregnancy resulting from in vitro fertilization in third trimester See above  8. Insulin controlled gestational diabetes mellitus in second trimester  9. Supervision of high risk pregnancy in third trimester  Preterm labor symptoms and general obstetric precautions including but not limited to vaginal bleeding, contractions, leaking of fluid and fetal movement were reviewed in detail with the patient. Please refer to After Visit Summary for other counseling recommendations.   Return in about 2 weeks (around 11/14/2023) for in person, md visit, high risk ob.  Future Appointments  Date Time Provider Department Center  11/13/2023 11:15 AM Reva Bores, MD [facility]  11/28/2023  1:30 PM Anyanwu, Jethro Bastos, MD [facility]  12/12/2023  1:30 PM Richardson Landry, CNM [facility]  12/26/2023  1:30 PM Reva Bores, MD [facility]  01/02/2024  1:30 PM Jolayne Panther, Gigi Gin, MD [facility]  01/08/2024  1:30 PM Blandon Bing, MD [facility]  01/15/2024  1:30 PM Carlynn Herald, CNM [facility]    Willapa Bing, MD

## 2023-11-01 NOTE — Telephone Encounter (Signed)
 LVM for patient to c/b

## 2023-11-02 LAB — RPR: RPR Ser Ql: NONREACTIVE

## 2023-11-02 LAB — CBC
Hematocrit: 32 % — ABNORMAL LOW (ref 34.0–46.6)
Hemoglobin: 10.6 g/dL — ABNORMAL LOW (ref 11.1–15.9)
MCH: 28.5 pg (ref 26.6–33.0)
MCHC: 33.1 g/dL (ref 31.5–35.7)
MCV: 86 fL (ref 79–97)
Platelets: 250 10*3/uL (ref 150–450)
RBC: 3.72 x10E6/uL — ABNORMAL LOW (ref 3.77–5.28)
RDW: 13.8 % (ref 11.7–15.4)
WBC: 10.3 10*3/uL (ref 3.4–10.8)

## 2023-11-02 LAB — TSH+FREE T4
Free T4: 0.64 ng/dL — ABNORMAL LOW (ref 0.82–1.77)
TSH: 2.07 u[IU]/mL (ref 0.450–4.500)

## 2023-11-02 LAB — HIV ANTIBODY (ROUTINE TESTING W REFLEX): HIV Screen 4th Generation wRfx: NONREACTIVE

## 2023-11-04 NOTE — Telephone Encounter (Signed)
 3rd attempt: lvm for patient to cb and schedule for further refills.

## 2023-11-05 ENCOUNTER — Encounter: Payer: Self-pay | Admitting: Obstetrics and Gynecology

## 2023-11-05 DIAGNOSIS — O99013 Anemia complicating pregnancy, third trimester: Secondary | ICD-10-CM | POA: Insufficient documentation

## 2023-11-05 MED ORDER — LEVOTHYROXINE SODIUM 75 MCG PO TABS: 75.0000 ug | ORAL_TABLET | Freq: Every day | ORAL | 1 refills | Status: DC

## 2023-11-09 LAB — IRON AND TIBC
Iron Saturation: 12 % — ABNORMAL LOW (ref 15–55)
Iron: 68 ug/dL (ref 27–159)
Total Iron Binding Capacity: 584 ug/dL (ref 250–450)
UIBC: 516 ug/dL — ABNORMAL HIGH (ref 131–425)

## 2023-11-09 LAB — B12 AND FOLATE PANEL

## 2023-11-09 LAB — SPECIMEN STATUS REPORT

## 2023-11-09 LAB — FERRITIN: Ferritin: 28 ng/mL (ref 15–150)

## 2023-11-12 ENCOUNTER — Other Ambulatory Visit: Payer: Self-pay | Admitting: Obstetrics and Gynecology

## 2023-11-12 ENCOUNTER — Telehealth: Payer: Self-pay

## 2023-11-12 DIAGNOSIS — D509 Iron deficiency anemia, unspecified: Secondary | ICD-10-CM | POA: Insufficient documentation

## 2023-11-12 NOTE — Telephone Encounter (Signed)
 Dr. Aquilla Knapp, patient will be scheduled as soon as possible.  Auth Submission: NO AUTH NEEDED Site of care: Site of care: [facility] Payer: [employer] commercial of [place]  Medication & CPT/J Code(s) submitted: Venofer (Iron Sucrose) J1756 Route of submission (phone, fax, portal): phone Phone # [phone] Fax # Auth type: Buy/Bill PB Units/visits requested: 500mg  x 2 doses Reference number: [id number] Approval from: 11/12/23 to 01/12/24

## 2023-11-12 NOTE — Addendum Note (Signed)
 Addended by: Guillaume Weninger D on: 11/12/2023 10:14 AM   Modules accepted: Orders

## 2023-11-13 ENCOUNTER — Ambulatory Visit: Admitting: Family Medicine

## 2023-11-13 VITALS — BP 116/70 | HR 85

## 2023-11-13 DIAGNOSIS — O0993 Supervision of high risk pregnancy, unspecified, third trimester: Secondary | ICD-10-CM | POA: Diagnosis not present

## 2023-11-13 DIAGNOSIS — O24112 Pre-existing diabetes mellitus, type 2, in pregnancy, second trimester: Secondary | ICD-10-CM

## 2023-11-13 DIAGNOSIS — O99013 Anemia complicating pregnancy, third trimester: Secondary | ICD-10-CM | POA: Diagnosis not present

## 2023-11-13 DIAGNOSIS — O24419 Gestational diabetes mellitus in pregnancy, unspecified control: Secondary | ICD-10-CM | POA: Diagnosis not present

## 2023-11-13 DIAGNOSIS — Z3A30 30 weeks gestation of pregnancy: Secondary | ICD-10-CM

## 2023-11-13 DIAGNOSIS — O09523 Supervision of elderly multigravida, third trimester: Secondary | ICD-10-CM

## 2023-11-13 DIAGNOSIS — O09813 Supervision of pregnancy resulting from assisted reproductive technology, third trimester: Secondary | ICD-10-CM | POA: Diagnosis not present

## 2023-11-13 DIAGNOSIS — Z6841 Body Mass Index (BMI) 40.0 and over, adult: Secondary | ICD-10-CM

## 2023-11-13 MED ORDER — INSULIN DETEMIR 100 UNIT/ML ~~LOC~~ SOLN: 20.0000 [IU] | Freq: Every day | SUBCUTANEOUS | Status: DC

## 2023-11-13 NOTE — Progress Notes (Signed)
 ROB: denies any concerns  Up to 20 units now   Iron- Scheduled Monday 11/18/23

## 2023-11-13 NOTE — Progress Notes (Signed)
 Aaron Aas  PRENATAL VISIT NOTE  Subjective:  Emily Banks is a 43 y.o. G2P0010 at [redacted]w[redacted]d being seen today for ongoing prenatal care.  She is currently monitored for the following issues for this high-risk pregnancy and has Anxiety and depression; Pregnancy resulting from in vitro fertilization; BMI 40.0-44.9, adult (HCC); Maternal morbid obesity, antepartum (HCC); Supervision of high-risk pregnancy; Advanced maternal age >24; GDM, class A2; Hypothyroid in pregnancy, antepartum, second trimester; Anemia in pregnancy, third trimester; and Iron deficiency anemia of mother during pregnancy on their problem list.  Patient reports no complaints.  Contractions: Not present. Vag. Bleeding: None.  Movement: Present. Denies leaking of fluid.   The following portions of the patient's history were reviewed and updated as appropriate: allergies, current medications, past family history, past medical history, past social history, past surgical history and problem list.   Objective:   Vitals:   11/13/23 1116  BP: 116/70  Pulse: 85    Fetal Status: Fetal Heart Rate (bpm): 147-150 Fundal Height: 32 cm Movement: Present     General:  Alert, oriented and cooperative. Patient is in no acute distress.  Skin: Skin is warm and dry. No rash noted.   Cardiovascular: Normal heart rate noted  Respiratory: Normal respiratory effort, no problems with respiration noted  Abdomen: Soft, gravid, appropriate for gestational age.  Pain/Pressure: Present     Pelvic: Cervical exam deferred        Extremities: Normal range of motion.  Edema: None  Mental Status: Normal mood and affect. Normal behavior. Normal judgment and thought content.   Assessment and Plan:  Pregnancy: G2P0010 at [redacted]w[redacted]d 1. Supervision of high risk pregnancy in third trimester (Primary) Continue prenatal care.  2. GDM, class A2 Dexcom app checked 88% in range.  Average is 128 On metformin  and up to 20 u levemir  right now Consider meal coverage if  needed. - insulin  detemir (LEVEMIR ) 100 UNIT/ML injection; Inject 0.2 mLs (20 Units total) into the skin at bedtime. - US  MFM OB FOLLOW UP; Future  3. Pregnancy resulting from in vitro fertilization in third trimester Normal recent growth 86%  4. Anemia in pregnancy, third trimester Iron infusions upcoming  5. Multigravida of advanced maternal age in third trimester In testing starting @ 32 weeks - US  MFM OB FOLLOW UP; Future  6. BMI 40.0-44.9, adult (HCC) Growth WNL, in testing - US  MFM OB FOLLOW UP; Future  7. [redacted] weeks gestation of pregnancy   Preterm labor symptoms and general obstetric precautions including but not limited to vaginal bleeding, contractions, leaking of fluid and fetal movement were reviewed in detail with the patient. Please refer to After Visit Summary for other counseling recommendations.   Return in 2 weeks (on 11/27/2023).  Future Appointments  Date Time Provider Department Center  11/18/2023  9:30 AM [facility] 1 [facility] None  11/19/2023  9:20 AM Gabriel John, NP [facility]  11/28/2023  1:30 PM Anyanwu, Kathrine Paris, MD [facility]  12/02/2023  9:00 AM [facility] 7 [facility] None  12/05/2023  1:10 PM [facility] NST [facility]  12/12/2023  1:10 PM [facility] NST [facility]  12/12/2023  1:30 PM Warren-Hill, Aviva Lemmings, CNM [facility]  12/19/2023  1:10 PM [facility] NST [facility]  12/26/2023  1:30 PM Granville Layer, MD [facility]  01/02/2024  1:10 PM [facility] NST [facility]  01/02/2024  1:30 PM Constant, Carles Cheadle, MD [facility]  01/08/2024  1:30 PM Raynell Caller, MD [facility]  01/09/2024  1:10 PM [facility] NST [facility]  [facility]  01/15/2024  1:30 PM Tari Fare, CNM [facility]    Granville Layer, MD

## 2023-11-17 ENCOUNTER — Other Ambulatory Visit: Payer: Self-pay | Admitting: Obstetrics and Gynecology

## 2023-11-18 ENCOUNTER — Ambulatory Visit (INDEPENDENT_AMBULATORY_CARE_PROVIDER_SITE_OTHER)

## 2023-11-18 VITALS — BP 136/88 | HR 80 | Temp 98.2°F | Resp 18 | Ht 66.0 in | Wt 254.4 lb

## 2023-11-18 DIAGNOSIS — O99019 Anemia complicating pregnancy, unspecified trimester: Secondary | ICD-10-CM

## 2023-11-18 DIAGNOSIS — Z3A31 31 weeks gestation of pregnancy: Secondary | ICD-10-CM

## 2023-11-18 DIAGNOSIS — O99013 Anemia complicating pregnancy, third trimester: Secondary | ICD-10-CM

## 2023-11-18 DIAGNOSIS — D508 Other iron deficiency anemias: Secondary | ICD-10-CM

## 2023-11-18 MED ORDER — METHYLPREDNISOLONE SODIUM SUCC 125 MG IJ SOLR: 125.0000 mg | Freq: Once | INTRAMUSCULAR | Status: DC | PRN

## 2023-11-18 MED ORDER — ALBUTEROL SULFATE HFA 108 (90 BASE) MCG/ACT IN AERS: 2.0000 | INHALATION_SPRAY | Freq: Once | RESPIRATORY_TRACT | Status: DC | PRN

## 2023-11-18 MED ORDER — DIPHENHYDRAMINE HCL 50 MG/ML IJ SOLN: 50.0000 mg | Freq: Once | INTRAMUSCULAR | Status: DC | PRN

## 2023-11-18 MED ORDER — FAMOTIDINE IN NACL 20-0.9 MG/50ML-% IV SOLN: 20.0000 mg | Freq: Once | INTRAVENOUS | Status: DC | PRN

## 2023-11-18 MED ORDER — EPINEPHRINE 0.3 MG/0.3ML IJ SOAJ: 0.3000 mg | Freq: Once | INTRAMUSCULAR | Status: DC | PRN

## 2023-11-18 MED ORDER — SODIUM CHLORIDE 0.9 % IV SOLN
500.0000 mg | Freq: Once | INTRAVENOUS | Status: AC
  Administered 2023-11-18: 500 mg via INTRAVENOUS
  Filled 2023-11-18: qty 25

## 2023-11-18 NOTE — Progress Notes (Signed)
 Diagnosis: Acute Anemia  Provider:  Phyllis Breeze MD  Procedure: IV Infusion  IV Type: Peripheral, IV Location: L Forearm  Venofer (Iron Sucrose), Dose: 500 mg  Infusion Start Time: 1031  Infusion Stop Time: 1455  Post Infusion IV Care: Observation period completed and Peripheral IV Discontinued  Discharge: Condition: Good, Destination: Home . AVS Declined  Performed by:  Shirly Dow, RN

## 2023-11-19 ENCOUNTER — Ambulatory Visit: Admitting: Primary Care

## 2023-11-19 ENCOUNTER — Encounter: Payer: Self-pay | Admitting: Primary Care

## 2023-11-19 VITALS — BP 118/70 | HR 87 | Temp 98.0°F | Ht 66.0 in | Wt 256.2 lb

## 2023-11-19 DIAGNOSIS — Z0000 Encounter for general adult medical examination without abnormal findings: Secondary | ICD-10-CM | POA: Diagnosis not present

## 2023-11-19 DIAGNOSIS — Z3A31 31 weeks gestation of pregnancy: Secondary | ICD-10-CM

## 2023-11-19 DIAGNOSIS — O99282 Endocrine, nutritional and metabolic diseases complicating pregnancy, second trimester: Secondary | ICD-10-CM | POA: Diagnosis not present

## 2023-11-19 DIAGNOSIS — F32A Depression, unspecified: Secondary | ICD-10-CM

## 2023-11-19 DIAGNOSIS — E039 Hypothyroidism, unspecified: Secondary | ICD-10-CM

## 2023-11-19 DIAGNOSIS — F419 Anxiety disorder, unspecified: Secondary | ICD-10-CM | POA: Diagnosis not present

## 2023-11-19 NOTE — Assessment & Plan Note (Addendum)
 Immunizations UTD. Pap smear UTD. Mammogram due postpartum per GYN  Discussed the importance of a healthy diet and regular exercise in order for weight loss, and to reduce the risk of further co-morbidity.  Exam stable. Labs reviewed   Follow up in 1 year for repeat physical.

## 2023-11-19 NOTE — Assessment & Plan Note (Signed)
 Controlled.  Continue sertraline 100 mg daily.

## 2023-11-19 NOTE — Patient Instructions (Signed)
 It was a pleasure to see you today!

## 2023-11-19 NOTE — Assessment & Plan Note (Signed)
 Following with GYN. Reviewed thyroid  studies from April 2025.  Continue levothyroxine  75 mcg daily.

## 2023-11-19 NOTE — Progress Notes (Signed)
 Subjective:    Patient ID: Emily Banks, female    DOB: 11/11/1980, 43 y.o.   MRN: [id number]  HPI  Emily Banks is a very pleasant 43 y.o. female who presents today for complete physical and follow up of chronic conditions.  Immunizations: -Tetanus: Completed in 2020 -Influenza: Completed last season   Diet: Fair diet.  Exercise: No regular exercise.  Eye exam: Completes annually  Dental exam: Completes semi-annually    Pap Smear: Completed in 2023 Mammogram: Completed in 2024  BP Readings from Last 3 Encounters:  11/19/23 118/70  11/18/23 136/88  11/13/23 116/70         Review of Systems  Constitutional:  Negative for unexpected weight change.  HENT:  Negative for rhinorrhea.   Respiratory:  Negative for cough and shortness of breath.   Cardiovascular:  Negative for chest pain.  Gastrointestinal:  Negative for constipation and diarrhea.  Genitourinary:  Negative for difficulty urinating.  Musculoskeletal:  Negative for arthralgias and myalgias.  Skin:  Negative for rash.  Allergic/Immunologic: Negative for environmental allergies.  Neurological:  Negative for dizziness and headaches.  Psychiatric/Behavioral:  The patient is not nervous/anxious.          Past Medical History:  Diagnosis Date   Abnormal uterine bleeding 10/13/2020   Anxiety and depression    Biliary colic 04/10/2019   Abd US  03/2019 - cholelithiasis, fatty liver   Carpal tunnel syndrome 04/09/2017   Dermatitis 01/03/2021   Ectopic pregnancy 2016   Elevated blood pressure reading 01/31/2016   Fatty liver    PCOS (polycystic ovarian syndrome)    Pelvic pain 10/13/2020   Type 2 diabetes mellitus (HCC)     Social History   Socioeconomic History   Marital status: Married    Spouse name: Not on file   Number of children: Not on file   Years of education: Not on file   Highest education level: Associate degree: occupational, Scientist, product/process development, or vocational program  Occupational  History   Not on file  Tobacco Use   Smoking status: Former   Smokeless tobacco: Never  Vaping Use   Vaping status: Former   Start date: 06/04/2015   Substances: Nicotine  Substance and Sexual Activity   Alcohol use: Yes    Alcohol/week: 0.0 standard drinks of alcohol    Comment: soical   Drug use: Never   Sexual activity: Yes  Other Topics Concern   Not on file  Social History Narrative   Married.   Enjoys playing games, spending time with family.   Social Drivers of Corporate investment banker Strain: Low Risk  (11/18/2023)   Overall Financial Resource Strain (CARDIA)    Difficulty of Paying Living Expenses: Not very hard  Food Insecurity: No Food Insecurity (11/18/2023)   Hunger Vital Sign    Worried About Running Out of Food in the Last Year: Never true    Ran Out of Food in the Last Year: Never true  Transportation Needs: No Transportation Needs (11/18/2023)   PRAPARE - Administrator, Civil Service (Medical): No    Lack of Transportation (Non-Medical): No  Physical Activity: Insufficiently Active (11/18/2023)   Exercise Vital Sign    Days of Exercise per Week: 7 days    Minutes of Exercise per Session: 20 min  Stress: Stress Concern Present (11/18/2023)   [employer] of Occupational Health - Occupational Stress Questionnaire    Feeling of Stress : To some extent  Social Connections: Moderately Isolated (11/18/2023)  Social Advertising account executive [NHANES]    Frequency of Communication with Friends and Family: More than three times a week    Frequency of Social Gatherings with Friends and Family: Once a week    Attends Religious Services: Never    Database administrator or Organizations: No    Attends Engineer, structural: Not on file    Marital Status: Married  Catering manager Violence: Not on file    Past Surgical History:  Procedure Laterality Date   CHOLECYSTECTOMY N/A 06/10/2020   Procedure: LAPAROSCOPIC CHOLECYSTECTOMY;   Surgeon: Jacolyn Matar, MD;  Location: [facility];  Service: General;  Laterality: N/A;   DILATION AND CURETTAGE OF UTERUS      Family History  Problem Relation Age of Onset   Diabetes Father    Hypertension Father    Heart disease Father    Melanoma Father    Hyperlipidemia Mother    Diabetes Brother    Hypertension Brother    Ovarian cancer Paternal Aunt    Esophageal cancer Maternal Grandmother     No Known Allergies  Current Outpatient Medications on File Prior to Visit  Medication Sig Dispense Refill   aspirin  EC 81 MG tablet Take 1 tablet (81 mg total) by mouth at bedtime. Start taking when you are [redacted] weeks pregnant for rest of pregnancy for prevention of preeclampsia 300 tablet 2   cholecalciferol (VITAMIN D3) 25 MCG (1000 UNIT) tablet Take 1,000 Units by mouth daily.     Continuous Glucose Sensor (DEXCOM G7 SENSOR) MISC Use one sensor every 10 days as instructed 9 each 3   insulin  detemir (LEVEMIR ) 100 UNIT/ML injection Inject 0.2 mLs (20 Units total) into the skin at bedtime.     Insulin  Syringes, Disposable, U-100 0.5 ML MISC Use as instructed with insulin  100 each 0   levothyroxine  (SYNTHROID ) 75 MCG tablet Take 1 tablet (75 mcg total) by mouth daily before breakfast. 30 tablet 1   metFORMIN  (GLUCOPHAGE ) 1000 MG tablet Take 1,000 mg by mouth 2 (two) times daily.     Prenatal Vit-Fe Fumarate-FA (PRENATAL MULTIVITAMIN) TABS tablet Take 1 tablet by mouth daily at 12 noon.     sertraline  (ZOLOFT ) 100 MG tablet Take 1 tablet (100 mg total) by mouth daily. For anxiety and depression. 90 tablet 0   hydrOXYzine  (ATARAX /VISTARIL ) 10 MG tablet Take 1-2 tablets (10-20 mg total) by mouth 2 (two) times daily as needed for anxiety. (Patient not taking: Reported on 10/09/2023) 60 tablet 0   No current facility-administered medications on file prior to visit.    BP 118/70   Pulse 87   Temp 98 F (36.7 C) (Oral)   Ht 5\' 6"  (1.676 m)   Wt 256 lb 3.2 oz (116.2 kg)   LMP 11/02/2022  (Exact Date)   SpO2 97%   BMI 41.35 kg/m  Objective:   Physical Exam HENT:     Right Ear: Tympanic membrane and ear canal normal.     Left Ear: Tympanic membrane and ear canal normal.  Eyes:     Pupils: Pupils are equal, round, and reactive to light.  Cardiovascular:     Rate and Rhythm: Normal rate and regular rhythm.  Pulmonary:     Effort: Pulmonary effort is normal.     Breath sounds: Normal breath sounds.  Abdominal:     General: Bowel sounds are normal.     Palpations: Abdomen is soft.     Tenderness: There is no abdominal tenderness.  Musculoskeletal:  General: Normal range of motion.     Cervical back: Neck supple.  Skin:    General: Skin is warm and dry.  Neurological:     Mental Status: She is alert and oriented to person, place, and time.     Cranial Nerves: No cranial nerve deficit.     Deep Tendon Reflexes:     Reflex Scores:      Patellar reflexes are 2+ on the right side and 2+ on the left side. Psychiatric:        Mood and Affect: Mood normal.           Assessment & Plan:  Preventative health care Assessment & Plan: Immunizations UTD. Pap smear UTD. Mammogram due postpartum per GYN  Discussed the importance of a healthy diet and regular exercise in order for weight loss, and to reduce the risk of further co-morbidity.  Exam stable. Labs reviewed   Follow up in 1 year for repeat physical.    Hypothyroid in pregnancy, antepartum, second trimester Assessment & Plan: Following with GYN. Reviewed thyroid  studies from April 2025.  Continue levothyroxine  75 mcg daily.   Anxiety and depression Assessment & Plan: Controlled.  Continue sertraline  100 mg daily.         Nga Rabon K Thelbert Gartin, NP

## 2023-11-28 ENCOUNTER — Ambulatory Visit: Admitting: Obstetrics & Gynecology

## 2023-11-28 ENCOUNTER — Ambulatory Visit

## 2023-11-28 ENCOUNTER — Other Ambulatory Visit

## 2023-11-28 ENCOUNTER — Encounter: Payer: Self-pay | Admitting: Obstetrics & Gynecology

## 2023-11-28 VITALS — BP 128/75 | HR 88 | Wt 256.0 lb

## 2023-11-28 DIAGNOSIS — O99019 Anemia complicating pregnancy, unspecified trimester: Secondary | ICD-10-CM

## 2023-11-28 DIAGNOSIS — O99283 Endocrine, nutritional and metabolic diseases complicating pregnancy, third trimester: Secondary | ICD-10-CM | POA: Diagnosis not present

## 2023-11-28 DIAGNOSIS — O09523 Supervision of elderly multigravida, third trimester: Secondary | ICD-10-CM

## 2023-11-28 DIAGNOSIS — E039 Hypothyroidism, unspecified: Secondary | ICD-10-CM

## 2023-11-28 DIAGNOSIS — Z3A32 32 weeks gestation of pregnancy: Secondary | ICD-10-CM

## 2023-11-28 DIAGNOSIS — Z23 Encounter for immunization: Secondary | ICD-10-CM

## 2023-11-28 DIAGNOSIS — D509 Iron deficiency anemia, unspecified: Secondary | ICD-10-CM

## 2023-11-28 DIAGNOSIS — O0993 Supervision of high risk pregnancy, unspecified, third trimester: Secondary | ICD-10-CM

## 2023-11-28 DIAGNOSIS — O24913 Unspecified diabetes mellitus in pregnancy, third trimester: Secondary | ICD-10-CM | POA: Diagnosis not present

## 2023-11-28 DIAGNOSIS — O09813 Supervision of pregnancy resulting from assisted reproductive technology, third trimester: Secondary | ICD-10-CM

## 2023-11-28 MED ORDER — INSULIN DETEMIR 100 UNIT/ML ~~LOC~~ SOLN: 22.0000 [IU] | Freq: Every day | SUBCUTANEOUS | Status: DC

## 2023-11-28 NOTE — Progress Notes (Signed)
 PRENATAL VISIT NOTE  Subjective:  Emily Banks is a 43 y.o. G2P0010 at [redacted]w[redacted]d being seen today for ongoing prenatal care.  She is currently monitored for the following issues for this high-risk pregnancy and has Anxiety and depression; Preventative health care; Pregnancy resulting from in vitro fertilization; BMI 40.0-44.9, adult (HCC); Maternal morbid obesity, antepartum (HCC); Supervision of high-risk pregnancy; Advanced maternal age >80; GDM, class A2; Hypothyroid in pregnancy, antepartum, third trimester; Anemia in pregnancy, third trimester; and Iron  deficiency anemia of mother during pregnancy on their problem list.  Patient reports no complaints.  Contractions: Not present. Vag. Bleeding: None.  Movement: Present. Denies leaking of fluid.   The following portions of the patient's history were reviewed and updated as appropriate: allergies, current medications, past family history, past medical history, past social history, past surgical history and problem list.   Objective:   Vitals:   11/28/23 1333 11/28/23 1342  BP: 137/82 128/75  Pulse: 88   Weight: 256 lb (116.1 kg)      Fetal Status: Fetal Heart Rate (bpm): 148   Movement: Present      General: Alert, oriented and cooperative. Patient is in no acute distress.  Skin: Skin is warm and dry. No rash noted.   Cardiovascular: Normal heart rate noted  Respiratory: Normal respiratory effort, no problems with respiration noted  Abdomen: Soft, gravid, appropriate for gestational age.  Pain/Pressure: Absent     Pelvic: Cervical exam deferred        Extremities: Normal range of motion.     Mental Status: Normal mood and affect. Normal behavior. Normal judgment and thought content.   US  MFM OB FOLLOW UP Result Date: 10/30/2023 ----------------------------------------------------------------------  OBSTETRICS REPORT                       (Signed Final 10/30/2023 01:36 pm)  ---------------------------------------------------------------------- Patient Info  ID #:       [id number]                          D.O.B.:  [birth year] (43 yrs)(F)  Name:       Emily Banks              Visit Date: 10/30/2023 09:09 am ---------------------------------------------------------------------- Performed By  Attending:        Sal Crass MD         Ref. Address:     [address]                                                             [address]  Performed By:     Joyann Allen RDMS      [facility]  Referred By:      [facility] Roda Cirri ---------------------------------------------------------------------- Orders  #  Description                           Code        Ordered By  1  US  MFM OB FOLLOW UP                   65784.69    [name] ----------------------------------------------------------------------  #  Order #                     Accession #                Episode #  1  [id number]                   [id number]                 [id number] ---------------------------------------------------------------------- Indications  Advanced maternal age multigravida 37+,        O60.523  third trimester  Obesity complicating pregnancy, third          O99.213  trimester  Pregnancy resulting from assisted              O84.819  reproductive technology (IVF)  Gestational diabetes in pregnancy, insulin      O24.414  controlled (early onset- insulin )  Hypothyroid (Synthroid )                        O99.280 E03.9  [redacted] weeks gestation of pregnancy                Z3A.28  Neg Car[employer] screening; AFP neg; Female per  pt ---------------------------------------------------------------------- Vital Signs  BP:          127/70  ---------------------------------------------------------------------- Fetal Evaluation  Num Of Fetuses:         1  Fetal Heart Rate(bpm):  148  Cardiac Activity:       Observed  Presentation:           Breech  Placenta:               Anterior  P. Cord Insertion:      Previously seen  Amniotic Fluid  AFI FV:      Within normal limits  AFI Sum(cm)     %Tile       Largest Pocket(cm)  11.5            24          3.5  RUQ(cm)       RLQ(cm)       LUQ(cm)        LLQ(cm)  3.11          3.5           1.98           2.91 ---------------------------------------------------------------------- Biometry  BPD:      71.8  mm     G. Age:  28w 6d         36  %  CI:        68.58   %    70 - 86                                                          FL/HC:      20.4   %    19.6 - 20.8  HC:      277.1  mm     G. Age:  30w 2d         62  %    HC/AC:      1.04        0.99 - 1.21  AC:      267.7  mm     G. Age:  30w 6d         92  %    FL/BPD:     78.6   %    71 - 87  FL:       56.4  mm     G. Age:  29w 4d         58  %    FL/AC:      21.1   %    20 - 24  Est. FW:    1541  gm      3 lb 6 oz     86  % ---------------------------------------------------------------------- OB History  Blood Type:   B+  Gravidity:    2  Ectopic:      1        Living:  0 ---------------------------------------------------------------------- Gestational Age  LMP:           51w 5d        Date:  11/02/22                  EDD:   08/09/23  U/S Today:     29w 6d                                        EDD:   01/09/24  Best:          28w 6d     Det. ByLarae Plaster Transfer          EDD:   01/16/24                                      (05/01/23) ---------------------------------------------------------------------- Anatomy  Cranium:               Previously seen        Aortic Arch:            Appears normal  Cavum:                 Previously seen        Ductal Arch:            Previously seen  Ventricles:            Appears normal         Diaphragm:              Appears  normal  Choroid Plexus:  Previously seen        Stomach:                Appears normal, left                                                                        sided  Cerebellum:            Previously seen        Abdomen:                Previously seen  Posterior Fossa:       Previously seen        Abdominal Wall:         Previously seen  Face:                  Orbits and profile     Cord Vessels:           Previously seen                         previously seen  Lips:                  Previously seen        Kidneys:                Appear normal  Thoracic:              Previously seen        Bladder:                Appears normal  Heart:                 Appears normal         Spine:                  Not well visualized                         (4CH, axis, and                         situs)  RVOT:                  Previously seen        Upper Extremities:      Previously seen  LVOT:                  Previously seen        Lower Extremities:      Previously seen  Other:  AA  cleared  L and S spine not cleared ---------------------------------------------------------------------- Cervix Uterus Adnexa  Cervix  Not visualized (advanced GA >24wks)  Uterus  No abnormality visualized.  Right Ovary  Not visualized.  Left Ovary  Not visualized.  Cul De Sac  No free fluid seen.  Adnexa  No abnormality visualized ---------------------------------------------------------------------- Comments  Emily Banks is currently at 28 weeks and 5 days.  She  has been followed due to advanced maternal age (43 years  old), IVF pregnancy, maternal obesity, and gestational  diabetes currently treated with insulin  and metformin .  She reports that her fasting fingerstick values remain  elevated in the 95-100s range.  On today's exam, the overall EFW of 3 pounds 6 ounces  measures at the 86 percentile for her gestational age.  There was normal amniotic fluid noted with a total AFI of 11.5  cm.  As her fasting fingerstick values  are elevated, she was  advised to increase the dosage of her long-acting (Levemir )  insulin  from 17 units to 19 units at bedtime.  Due to advanced maternal age and her underlying medical  conditions, we will start weekly fetal testing at 32 weeks.  She will return in 4 weeks for a growth scan and BPP.  A total of 20 minutes was spent counseling and coordinating  the care for this patient.  Greater than 50% of the time was  spent in direct face-to-face contact. ----------------------------------------------------------------------                  Sal Crass, MD Electronically Signed Final Report   10/30/2023 01:36 pm ----------------------------------------------------------------------    Assessment and Plan:  Pregnancy: G2P0010 at [redacted]w[redacted]d 1. Diabetes mellitus during pregnancy in third trimester (A2GDM vs T2DM) (Primary) Dexcom app checked, most (86%) within range, average 121.  Continue Metformin  and Levemir  22 u at bedtime.  Continue with scans and antenatal testing as scheduled by MFM. Will follow delivery plan recommendations.   2. Hypothyroid in pregnancy, antepartum, third trimester On Synthroid  75 mcg daily.  Will check thyroid  labs next visit.  3. Pregnancy resulting from in vitro fertilization in third trimester Continue with scans and antenatal testing as scheduled by MFM.  4. Iron  deficiency anemia of mother during pregnancy She is s/p Venofer  on 4/28, another one scheduled 12/02/23.   5. Need for Tdap vaccination - Tdap vaccine greater than or equal to 7yo IM given  6. Multigravida of advanced maternal age in third trimester 7. [redacted] weeks gestation of pregnancy 8. Supervision of high risk pregnancy in third trimester Continue with scans and antenatal testing as scheduled by MFM.  Preterm labor symptoms and general obstetric precautions including but not limited to vaginal bleeding, contractions, leaking of fluid and fetal movement were reviewed in detail with the patient. Please  refer to After Visit Summary for other counseling recommendations.   Return in about 2 weeks (around 12/12/2023) for OFFICE OB VISIT (MD only).  Future Appointments  Date Time Provider Department Center  12/02/2023  9:00 AM [facility]-CHAIR 7 [facility]-IN[facility] None  12/03/2023  2:00 PM [facility] PROVIDER 1 [facility]  12/03/2023  2:30 PM [facility] US3 [facility]  12/12/2023  1:10 PM [facility] NST [facility]  12/12/2023  1:30 PM Warren-Hill, Aviva Lemmings, CNM [facility]  12/19/2023  1:10 PM [facility] NST [facility]  12/26/2023  1:30 PM Granville Layer, MD [facility]  01/02/2024  1:10 PM [facility] NST [facility]  01/02/2024  1:30 PM Constant, Carles Cheadle, MD [facility]  01/08/2024  1:30 PM Raynell Caller, MD [facility]  01/09/2024  1:10 PM [facility] NST [facility]  01/15/2024  1:30 PM Tari Fare, CNM [facility]    Lenoard Rad, MD

## 2023-12-02 ENCOUNTER — Ambulatory Visit

## 2023-12-02 VITALS — BP 133/83 | HR 73 | Temp 97.9°F | Resp 16 | Ht 66.0 in | Wt 257.0 lb

## 2023-12-02 DIAGNOSIS — O99013 Anemia complicating pregnancy, third trimester: Secondary | ICD-10-CM

## 2023-12-02 DIAGNOSIS — D508 Other iron deficiency anemias: Secondary | ICD-10-CM

## 2023-12-02 DIAGNOSIS — Z3A33 33 weeks gestation of pregnancy: Secondary | ICD-10-CM

## 2023-12-02 DIAGNOSIS — D509 Iron deficiency anemia, unspecified: Secondary | ICD-10-CM

## 2023-12-02 MED ORDER — SODIUM CHLORIDE 0.9 % IV SOLN
500.0000 mg | Freq: Once | INTRAVENOUS | Status: AC
  Administered 2023-12-02: 500 mg via INTRAVENOUS
  Filled 2023-12-02: qty 15

## 2023-12-02 NOTE — Addendum Note (Signed)
 Addended by: Oswin Johal R on: 12/02/2023 01:52 PM   Modules accepted: Orders

## 2023-12-02 NOTE — Progress Notes (Cosign Needed Addendum)
 Diagnosis: Iron  Deficiency Anemia  Provider:  Praveen Mannam MD  Procedure: IV Infusion  IV Type: Peripheral, IV Location: L Upper Arm  Venofer  (Iron  Sucrose), Dose: 500 mg  Infusion Start Time: 0937  Infusion Stop Time: 1327  Post Infusion IV Care: Patient declined observation and Peripheral IV Discontinued  Discharge: Condition: Good, Destination: Home . AVS Declined  Performed by:  Lauran Pollard, LPN

## 2023-12-03 ENCOUNTER — Other Ambulatory Visit: Payer: Self-pay | Admitting: *Deleted

## 2023-12-03 ENCOUNTER — Ambulatory Visit: Attending: Family Medicine

## 2023-12-03 ENCOUNTER — Ambulatory Visit: Admitting: Maternal & Fetal Medicine

## 2023-12-03 ENCOUNTER — Other Ambulatory Visit: Payer: Self-pay | Admitting: Family Medicine

## 2023-12-03 ENCOUNTER — Encounter: Payer: Self-pay | Admitting: Family Medicine

## 2023-12-03 VITALS — BP 139/76 | HR 80

## 2023-12-03 DIAGNOSIS — O24414 Gestational diabetes mellitus in pregnancy, insulin controlled: Secondary | ICD-10-CM

## 2023-12-03 DIAGNOSIS — O09523 Supervision of elderly multigravida, third trimester: Secondary | ICD-10-CM | POA: Diagnosis not present

## 2023-12-03 DIAGNOSIS — O3663X0 Maternal care for excessive fetal growth, third trimester, not applicable or unspecified: Secondary | ICD-10-CM | POA: Diagnosis not present

## 2023-12-03 DIAGNOSIS — O24419 Gestational diabetes mellitus in pregnancy, unspecified control: Secondary | ICD-10-CM | POA: Insufficient documentation

## 2023-12-03 DIAGNOSIS — O99213 Obesity complicating pregnancy, third trimester: Secondary | ICD-10-CM

## 2023-12-03 DIAGNOSIS — Z3A33 33 weeks gestation of pregnancy: Secondary | ICD-10-CM | POA: Diagnosis not present

## 2023-12-03 DIAGNOSIS — Z6841 Body Mass Index (BMI) 40.0 and over, adult: Secondary | ICD-10-CM

## 2023-12-03 DIAGNOSIS — E669 Obesity, unspecified: Secondary | ICD-10-CM

## 2023-12-03 NOTE — Progress Notes (Unsigned)
   Patient information  Patient Name: Emily Banks  Patient MRN:   [id number]  Referring practice: {MFM Referring Provider:[id number]}  MFM CONSULT  Emily Banks is a 43 y.o. G2P0010 at [redacted]w[redacted]d here for ultrasound and consultation. Patient Active Problem List   Diagnosis Date Noted   Iron  deficiency anemia of mother during pregnancy 11/12/2023   Anemia in pregnancy, third trimester 11/05/2023   Hypothyroid in pregnancy, antepartum, third trimester 08/09/2023   GDM, class A2 08/08/2023   Pregnancy resulting from in vitro fertilization 07/25/2023   BMI 40.0-44.9, adult (HCC) 07/25/2023   Maternal morbid obesity, antepartum (HCC) 07/25/2023   Supervision of high-risk pregnancy 07/25/2023   Advanced maternal age >40 07/25/2023   Preventative health care 09/12/2018   Anxiety and depression 01/31/2016    [name] is doing well today with no acute concerns.  RE GDMA2: ***     ***  There are limitations of prenatal ultrasound such as the inability to detect certain abnormalities due to poor visualization. Various factors such as fetal position, gestational age and maternal body habitus may increase the difficulty in visualizing the fetal anatomy.    Recommendations -Serial growth ultrasounds every 4-6 weeks until delivery -Antenatal testing to start around 32 weeks  -Delivery around *** weeks gestation  Review of Systems: A review of systems was performed and was negative except per HPI   Vitals and Physical Exam    12/03/2023    2:32 PM 12/02/2023    9:02 AM 12/02/2023    1:27 AM  Vitals with BMI  Height  5\' 6"    Weight  257 lbs   BMI  41.5   Systolic 139 133 284  Diastolic 76 83 75  Pulse 80 73 75    Sitting comfortably on the sonogram table Nonlabored breathing Normal rate and rhythm Abdomen is nontender  Past pregnancies OB History  Gravida Para Term Preterm AB Living  2    1   SAB IAB Ectopic Multiple Live Births    1      # Outcome Date GA  Lbr Len/2nd Weight Sex Type Anes PTL Lv  2 Current           1 Ectopic              I spent *** minutes reviewing the patients chart, including labs and images as well as counseling the patient about her medical conditions. Greater than 50% of the time was spent in direct face-to-face patient counseling.  [name]  MFM, [facility]   12/03/2023  3:25 PM

## 2023-12-04 ENCOUNTER — Other Ambulatory Visit: Payer: Self-pay

## 2023-12-04 ENCOUNTER — Other Ambulatory Visit: Payer: Self-pay | Admitting: Primary Care

## 2023-12-04 DIAGNOSIS — E1165 Type 2 diabetes mellitus with hyperglycemia: Secondary | ICD-10-CM

## 2023-12-04 MED ORDER — METFORMIN HCL 1000 MG PO TABS
1000.0000 mg | ORAL_TABLET | Freq: Two times a day (BID) | ORAL | 0 refills | Status: DC
Start: 2023-12-04 — End: 2023-12-09
  Filled 2023-12-04: qty 180, 90d supply, fill #0

## 2023-12-05 ENCOUNTER — Other Ambulatory Visit

## 2023-12-05 ENCOUNTER — Ambulatory Visit

## 2023-12-09 ENCOUNTER — Encounter: Payer: Self-pay | Admitting: Obstetrics and Gynecology

## 2023-12-09 ENCOUNTER — Telehealth: Admitting: Obstetrics and Gynecology

## 2023-12-09 ENCOUNTER — Telehealth: Payer: Self-pay

## 2023-12-09 ENCOUNTER — Other Ambulatory Visit

## 2023-12-09 ENCOUNTER — Other Ambulatory Visit: Payer: Self-pay

## 2023-12-09 VITALS — BP 135/82 | HR 80

## 2023-12-09 DIAGNOSIS — E039 Hypothyroidism, unspecified: Secondary | ICD-10-CM | POA: Diagnosis not present

## 2023-12-09 DIAGNOSIS — O3663X0 Maternal care for excessive fetal growth, third trimester, not applicable or unspecified: Secondary | ICD-10-CM | POA: Diagnosis not present

## 2023-12-09 DIAGNOSIS — O99013 Anemia complicating pregnancy, third trimester: Secondary | ICD-10-CM

## 2023-12-09 DIAGNOSIS — O99283 Endocrine, nutritional and metabolic diseases complicating pregnancy, third trimester: Secondary | ICD-10-CM

## 2023-12-09 DIAGNOSIS — O24419 Gestational diabetes mellitus in pregnancy, unspecified control: Secondary | ICD-10-CM

## 2023-12-09 DIAGNOSIS — E1165 Type 2 diabetes mellitus with hyperglycemia: Secondary | ICD-10-CM

## 2023-12-09 DIAGNOSIS — D649 Anemia, unspecified: Secondary | ICD-10-CM

## 2023-12-09 DIAGNOSIS — O24415 Gestational diabetes mellitus in pregnancy, controlled by oral hypoglycemic drugs: Secondary | ICD-10-CM | POA: Diagnosis not present

## 2023-12-09 DIAGNOSIS — O09523 Supervision of elderly multigravida, third trimester: Secondary | ICD-10-CM

## 2023-12-09 DIAGNOSIS — O99213 Obesity complicating pregnancy, third trimester: Secondary | ICD-10-CM

## 2023-12-09 DIAGNOSIS — Z6841 Body Mass Index (BMI) 40.0 and over, adult: Secondary | ICD-10-CM

## 2023-12-09 DIAGNOSIS — O09813 Supervision of pregnancy resulting from assisted reproductive technology, third trimester: Secondary | ICD-10-CM

## 2023-12-09 DIAGNOSIS — Z3A34 34 weeks gestation of pregnancy: Secondary | ICD-10-CM

## 2023-12-09 MED ORDER — METFORMIN HCL 1000 MG PO TABS: 1000.0000 mg | ORAL_TABLET | Freq: Two times a day (BID) | ORAL | 0 refills | Status: DC

## 2023-12-09 NOTE — Addendum Note (Signed)
 Addended by: Eufemia Prindle on: 12/09/2023 03:43 PM   Modules accepted: Orders

## 2023-12-09 NOTE — Telephone Encounter (Signed)
 Called pt to move appt for today due to provider being out. No vm set up.

## 2023-12-09 NOTE — Progress Notes (Signed)
 Virtual ROB   Pt able to check B/P at home. States sugars have been ok.   Fasting is 80-90. 130 after meal pt states will balance out after a while   *Needs refill on Metformin . *[facility].

## 2023-12-09 NOTE — Progress Notes (Signed)
 TELEHEALTH OBSTETRICS VISIT ENCOUNTER NOTE  Provider location: [facility]   Patient location: Home  I connected with Emily Banks on 12/09/23 at  1:10 PM EDT by telephone at home and verified that I am speaking with the correct person using two identifiers. Of note, unable to do video encounter due to technical difficulties.    I discussed the limitations, risks, security and privacy concerns of performing an evaluation and management service by telephone and the availability of in person appointments. I also discussed with the patient that there may be a patient responsible charge related to this service. The patient expressed understanding and agreed to proceed.  Subjective:  Emily Banks is a 43 y.o. G2P0010 at 109w3d being followed for ongoing prenatal care.  She is currently monitored for the following issues for this high-risk pregnancy and has Anxiety and depression; Preventative health care; Pregnancy resulting from in vitro fertilization; BMI 40.0-44.9, adult (HCC); Maternal morbid obesity, antepartum (HCC); Supervision of high-risk pregnancy; Advanced maternal age >36; GDM, class A2; Hypothyroid in pregnancy, antepartum, third trimester; Anemia in pregnancy, third trimester; Iron  deficiency anemia of mother during pregnancy; and Excessive fetal growth affecting management of mother in third trimester, antepartum on their problem list.  Patient rescheduled from in person to virtual due to BPP being unable to be performed in office today.   Patient reports no complaints. Reports fetal movement. Denies any contractions, bleeding or leaking of fluid.   The following portions of the patient's history were reviewed and updated as appropriate: allergies, current medications, past family history, past medical history, past social history, past surgical history and problem list.   Objective:  Blood pressure 135/82, pulse 80, last menstrual  period 11/02/2022. General:  Alert, oriented and cooperative.   Mental Status: Normal mood and affect perceived. Normal judgment and thought content.  Rest of physical exam deferred due to type of encounter  Assessment and Plan:  Pregnancy: G2P0010 at [redacted]w[redacted]d 1. Hypothyroid in pregnancy, antepartum, third trimester (Primary) Re-check levels when patient comes in later this week for BPP. Patient currently on synthroid  75 - TSH + free T4; Future  2. [redacted] weeks gestation of pregnancy GBS and swabs next visit - TSH + free T4; Future  3. GDM, class A2 Patient confirms on levemir  22 at bedtime and metformin  1000 bid. AM fastings wnl and 1 to 1.5h post prandials (pt has dexcom) are around 140s-150s. D/w her to watch carb intake and post prandial walk but potential for need for meal coverage in the future. Pt currently doing levemir  vials so is comfortable drawing insulin .   For bpp later this week  D/w her 37wk IOL due to LGA on 5/13; pt amenable to IOL. Pt set up for 6/6 midnight IOL  5/13: efw 98%, 2864g, ac >99%, afi 16, cephalic, bpp 8/8  4. Excessive fetal growth affecting management of pregnancy in third trimester, single or unspecified fetus  5. Multigravida of advanced maternal age in third trimester  6. Anemia in pregnancy, third trimester S/p IV iron    7. BMI 40.0-44.9, adult (HCC)  8. Maternal morbid obesity, antepartum (HCC)  9. Pregnancy resulting from in vitro fertilization in third trimester   10. Type 2 diabetes mellitus with hyperglycemia, without long-term current use of insulin  (HCC) - metFORMIN  (GLUCOPHAGE ) 1000 MG tablet; Take 1 tablet (1,000 mg total) by mouth 2 (two) times daily. For blood sugar  Dispense: 60 tablet; Refill: 0  Preterm labor symptoms and general obstetric precautions  including but not limited to vaginal bleeding, contractions, leaking of fluid and fetal movement were reviewed in detail with the patient.  I discussed the assessment and treatment  plan with the patient. The patient was provided an opportunity to ask questions and all were answered. The patient agreed with the plan and demonstrated an understanding of the instructions. The patient was advised to call back or seek an in-person office evaluation/go to [facility] at [facility] for any urgent or concerning symptoms. Please refer to After Visit Summary for other counseling recommendations.   I provided 10 minutes of non-face-to-face time during this encounter.  No follow-ups on file.  Future Appointments  Date Time Provider Department Center  12/10/2023  2:10 PM [facility] NST [facility]  12/19/2023  1:10 PM [facility] NST [facility]  12/26/2023  1:30 PM Granville Layer, MD [facility]  01/01/2024  9:00 AM [facility] PROVIDER 1 [facility]  01/01/2024  9:30 AM [facility] US4 [facility]  01/02/2024  1:10 PM [facility] NST [facility]  01/02/2024  1:30 PM Constant, Carles Cheadle, MD [facility]  01/08/2024  1:30 PM Raynell Caller, MD [facility]  01/09/2024  1:10 PM [facility] NST [facility]    Raynell Caller, MD [facility]

## 2023-12-10 ENCOUNTER — Other Ambulatory Visit (INDEPENDENT_AMBULATORY_CARE_PROVIDER_SITE_OTHER)

## 2023-12-10 ENCOUNTER — Ambulatory Visit: Admitting: *Deleted

## 2023-12-10 ENCOUNTER — Other Ambulatory Visit: Payer: Self-pay

## 2023-12-10 DIAGNOSIS — Z3A34 34 weeks gestation of pregnancy: Secondary | ICD-10-CM

## 2023-12-10 DIAGNOSIS — O24419 Gestational diabetes mellitus in pregnancy, unspecified control: Secondary | ICD-10-CM

## 2023-12-10 DIAGNOSIS — O99283 Endocrine, nutritional and metabolic diseases complicating pregnancy, third trimester: Secondary | ICD-10-CM | POA: Diagnosis not present

## 2023-12-10 DIAGNOSIS — O0993 Supervision of high risk pregnancy, unspecified, third trimester: Secondary | ICD-10-CM

## 2023-12-10 DIAGNOSIS — E039 Hypothyroidism, unspecified: Secondary | ICD-10-CM

## 2023-12-10 LAB — HEMOGLOBIN A1C
Est. average glucose Bld gHb Est-mCnc: 111 mg/dL
Hgb A1c MFr Bld: 5.5 % (ref 4.8–5.6)

## 2023-12-10 NOTE — Progress Notes (Signed)

## 2023-12-11 ENCOUNTER — Ambulatory Visit: Payer: Self-pay | Admitting: Obstetrics and Gynecology

## 2023-12-11 ENCOUNTER — Other Ambulatory Visit: Payer: Self-pay | Admitting: Obstetrics and Gynecology

## 2023-12-11 LAB — TSH+FREE T4
Free T4: 0.71 ng/dL — ABNORMAL LOW (ref 0.82–1.77)
TSH: 0.783 u[IU]/mL (ref 0.450–4.500)

## 2023-12-11 MED ORDER — LEVOTHYROXINE SODIUM 88 MCG PO TABS: 88.0000 ug | ORAL_TABLET | Freq: Every day | ORAL | 0 refills | Status: DC

## 2023-12-12 ENCOUNTER — Other Ambulatory Visit

## 2023-12-12 ENCOUNTER — Ambulatory Visit

## 2023-12-12 ENCOUNTER — Encounter: Admitting: Certified Nurse Midwife

## 2023-12-19 ENCOUNTER — Encounter: Admitting: Obstetrics and Gynecology

## 2023-12-19 ENCOUNTER — Ambulatory Visit: Admitting: *Deleted

## 2023-12-19 ENCOUNTER — Other Ambulatory Visit

## 2023-12-19 ENCOUNTER — Ambulatory Visit

## 2023-12-19 ENCOUNTER — Other Ambulatory Visit (INDEPENDENT_AMBULATORY_CARE_PROVIDER_SITE_OTHER): Payer: Self-pay

## 2023-12-19 ENCOUNTER — Other Ambulatory Visit (HOSPITAL_COMMUNITY)
Admission: RE | Admit: 2023-12-19 | Discharge: 2023-12-19 | Disposition: A | Discharge status: Home / Self Care | Source: Ambulatory Visit | Attending: Obstetrics and Gynecology | Admitting: Obstetrics and Gynecology

## 2023-12-19 ENCOUNTER — Ambulatory Visit: Admitting: Obstetrics and Gynecology

## 2023-12-19 VITALS — BP 129/88 | HR 99 | Wt 259.0 lb

## 2023-12-19 DIAGNOSIS — O99019 Anemia complicating pregnancy, unspecified trimester: Secondary | ICD-10-CM

## 2023-12-19 DIAGNOSIS — O99343 Other mental disorders complicating pregnancy, third trimester: Secondary | ICD-10-CM

## 2023-12-19 DIAGNOSIS — O99283 Endocrine, nutritional and metabolic diseases complicating pregnancy, third trimester: Secondary | ICD-10-CM

## 2023-12-19 DIAGNOSIS — O99213 Obesity complicating pregnancy, third trimester: Secondary | ICD-10-CM

## 2023-12-19 DIAGNOSIS — O99013 Anemia complicating pregnancy, third trimester: Secondary | ICD-10-CM

## 2023-12-19 DIAGNOSIS — O0993 Supervision of high risk pregnancy, unspecified, third trimester: Secondary | ICD-10-CM

## 2023-12-19 DIAGNOSIS — Z3A35 35 weeks gestation of pregnancy: Secondary | ICD-10-CM | POA: Insufficient documentation

## 2023-12-19 DIAGNOSIS — Z1331 Encounter for screening for depression: Secondary | ICD-10-CM

## 2023-12-19 DIAGNOSIS — Z3483 Encounter for supervision of other normal pregnancy, third trimester: Secondary | ICD-10-CM | POA: Insufficient documentation

## 2023-12-19 DIAGNOSIS — O09523 Supervision of elderly multigravida, third trimester: Secondary | ICD-10-CM

## 2023-12-19 DIAGNOSIS — O09813 Supervision of pregnancy resulting from assisted reproductive technology, third trimester: Secondary | ICD-10-CM

## 2023-12-19 DIAGNOSIS — F419 Anxiety disorder, unspecified: Secondary | ICD-10-CM

## 2023-12-19 DIAGNOSIS — E039 Hypothyroidism, unspecified: Secondary | ICD-10-CM

## 2023-12-19 DIAGNOSIS — O24419 Gestational diabetes mellitus in pregnancy, unspecified control: Secondary | ICD-10-CM

## 2023-12-19 DIAGNOSIS — O24913 Unspecified diabetes mellitus in pregnancy, third trimester: Secondary | ICD-10-CM

## 2023-12-19 DIAGNOSIS — Z6841 Body Mass Index (BMI) 40.0 and over, adult: Secondary | ICD-10-CM

## 2023-12-19 DIAGNOSIS — O3663X0 Maternal care for excessive fetal growth, third trimester, not applicable or unspecified: Secondary | ICD-10-CM

## 2023-12-19 MED ORDER — LEVOTHYROXINE SODIUM 88 MCG PO TABS: 88.0000 ug | ORAL_TABLET | Freq: Every day | ORAL | 0 refills | Status: DC

## 2023-12-19 NOTE — Progress Notes (Signed)

## 2023-12-20 ENCOUNTER — Encounter: Payer: Self-pay | Admitting: Obstetrics and Gynecology

## 2023-12-20 LAB — CERVICOVAGINAL ANCILLARY ONLY
Chlamydia: NEGATIVE
Comment: NEGATIVE
Comment: NORMAL
Neisseria Gonorrhea: NEGATIVE

## 2023-12-20 MED ORDER — INSULIN DETEMIR 100 UNIT/ML ~~LOC~~ SOLN: 26.0000 [IU] | Freq: Every day | SUBCUTANEOUS | Status: DC

## 2023-12-20 MED ORDER — INSULIN ASPART 100 UNIT/ML FLEXPEN: 4.0000 [IU] | PEN_INJECTOR | Freq: Three times a day (TID) | SUBCUTANEOUS | 11 refills | Status: DC

## 2023-12-20 NOTE — Progress Notes (Signed)
 PRENATAL VISIT NOTE  Subjective:  Emily Banks is a 43 y.o. G2P0010 at [redacted]w[redacted]d being seen today for ongoing prenatal care.  She is currently monitored for the following issues for this high-risk pregnancy and has Anxiety and depression; Preventative health care; Pregnancy resulting from in vitro fertilization; BMI 40.0-44.9, adult (HCC); Maternal morbid obesity, antepartum (HCC); Supervision of high-risk pregnancy; Advanced maternal age >42; GDM, class A2; Hypothyroid in pregnancy, antepartum, third trimester; Anemia in pregnancy, third trimester; Iron  deficiency anemia of mother during pregnancy; and Excessive fetal growth affecting management of mother in third trimester, antepartum on their problem list.  Patient reports no complaints.  Contractions: Not present. Vag. Bleeding: None.  Movement: Present. Denies leaking of fluid.   The following portions of the patient's history were reviewed and updated as appropriate: allergies, current medications, past family history, past medical history, past social history, past surgical history and problem list.   Objective:    Vitals:   12/19/23 1348  BP: 129/88  Pulse: 99  Weight: 259 lb (117.5 kg)    Fetal Status:  Fetal Heart Rate (bpm): NST   Movement: Present    General: Alert, oriented and cooperative. Patient is in no acute distress.  Skin: Skin is warm and dry. No rash noted.   Cardiovascular: Normal heart rate noted  Respiratory: Normal respiratory effort, no problems with respiration noted  Abdomen: Soft, gravid, appropriate for gestational age.  Pain/Pressure: Absent     Pelvic: Cervical exam deferred        Extremities: Normal range of motion.     Mental Status: Normal mood and affect. Normal behavior. Normal judgment and thought content.   Assessment and Plan:  Pregnancy: G2P0010 at [redacted]w[redacted]d 1. [redacted] weeks gestation of pregnancy (Primary) - Cervicovaginal ancillary only - Strep Gp B NAA  2. Hypothyroid in pregnancy,  antepartum, third trimester Pharmacy didn't get increased synthroid . Re-sent in and paper Rx given to patient. Recheck TFTs late June/early july  3. GDM, class A2 Patient on levemir  26 at bedtime which has helped with her AM fastings to be in the low 100s but 2 hour post prandials in the 140s and can be close to 200s. Recommend meal time coverage. Flexpen aspart 4u tidac sent in. I told her if she's going to eat a lot of carbs at meal that can increase by 2u to 6 units with that meal. Continue metformin  1000 bidac For IOL 6/6 midnight Bpp 10/10 today, cephalic, continue with weekly testing 5/13: 98%, 2864gm  4. Supervision of high risk pregnancy in third trimester Continue low dose ASA  5. Pregnancy resulting from in vitro fertilization in third trimester Declined fetal echo; mfm scans wnl  6. Maternal morbid obesity, antepartum (HCC) Weight stable  7. BMI 40.0-44.9, adult (HCC)  8. Excessive fetal growth affecting management of pregnancy in third trimester, single or unspecified fetus See above  9. Iron  deficiency anemia of mother during pregnancy S/p IV iron , last dose 5/12    Latest Ref Rng & Units 10/31/2023    9:38 AM 07/25/2023   10:26 AM 11/09/2021    9:42 AM  CBC  WBC 3.4 - 10.8 x10E3/uL 10.3  10.5  9.1   Hemoglobin 11.1 - 15.9 g/dL 29.5  62.1  30.8   Hematocrit 34.0 - 46.6 % 32.0  40.3  41.5   Platelets 150 - 450 x10E3/uL 250  293  277.0    10. Anemia in pregnancy, third trimester  11. Multigravida of advanced maternal age in third trimester  Low risk panorama  12. Anxiety and depression No issues on no meds  Preterm labor symptoms and general obstetric precautions including but not limited to vaginal bleeding, contractions, leaking of fluid and fetal movement were reviewed in detail with the patient. Please refer to After Visit Summary for other counseling recommendations.   No follow-ups on file.  Future Appointments  Date Time Provider Department Center   12/26/2023  1:30 PM Granville Layer, MD [facility]  12/26/2023  2:10 PM [facility] NST [facility]  12/27/2023 12:00 AM MC-LD SCHED ROOM [facility]-INDC None  01/01/2024  9:00 AM [facility] PROVIDER 1 [facility]  01/01/2024  9:30 AM [facility] US4 [facility]    Raynell Caller, MD

## 2023-12-21 LAB — STREP GP B NAA: Strep Gp B NAA: NEGATIVE

## 2023-12-25 ENCOUNTER — Encounter (HOSPITAL_COMMUNITY): Payer: Self-pay | Admitting: *Deleted

## 2023-12-25 ENCOUNTER — Telehealth (HOSPITAL_COMMUNITY): Payer: Self-pay | Admitting: *Deleted

## 2023-12-25 NOTE — Telephone Encounter (Signed)
 Preadmission screen

## 2023-12-26 ENCOUNTER — Ambulatory Visit: Admitting: *Deleted

## 2023-12-26 ENCOUNTER — Ambulatory Visit

## 2023-12-26 ENCOUNTER — Ambulatory Visit: Admitting: Family Medicine

## 2023-12-26 ENCOUNTER — Other Ambulatory Visit

## 2023-12-26 ENCOUNTER — Other Ambulatory Visit (INDEPENDENT_AMBULATORY_CARE_PROVIDER_SITE_OTHER): Payer: Self-pay

## 2023-12-26 VITALS — BP 144/86 | Wt 260.0 lb

## 2023-12-26 DIAGNOSIS — Z3A36 36 weeks gestation of pregnancy: Secondary | ICD-10-CM

## 2023-12-26 DIAGNOSIS — O0993 Supervision of high risk pregnancy, unspecified, third trimester: Secondary | ICD-10-CM | POA: Diagnosis not present

## 2023-12-26 DIAGNOSIS — O99283 Endocrine, nutritional and metabolic diseases complicating pregnancy, third trimester: Secondary | ICD-10-CM

## 2023-12-26 DIAGNOSIS — E039 Hypothyroidism, unspecified: Secondary | ICD-10-CM

## 2023-12-26 DIAGNOSIS — O99019 Anemia complicating pregnancy, unspecified trimester: Secondary | ICD-10-CM

## 2023-12-26 DIAGNOSIS — O24419 Gestational diabetes mellitus in pregnancy, unspecified control: Secondary | ICD-10-CM

## 2023-12-26 DIAGNOSIS — D509 Iron deficiency anemia, unspecified: Secondary | ICD-10-CM

## 2023-12-26 DIAGNOSIS — O99213 Obesity complicating pregnancy, third trimester: Secondary | ICD-10-CM

## 2023-12-26 DIAGNOSIS — O09523 Supervision of elderly multigravida, third trimester: Secondary | ICD-10-CM

## 2023-12-26 DIAGNOSIS — O09813 Supervision of pregnancy resulting from assisted reproductive technology, third trimester: Secondary | ICD-10-CM

## 2023-12-26 NOTE — Progress Notes (Signed)

## 2023-12-26 NOTE — H&P (Signed)
 OBSTETRIC ADMISSION HISTORY AND PHYSICAL  Emily Banks is a 43 y.o. female G2P0010 with IUP at [redacted]w[redacted]d by IVF presenting for IOL for A2GDM. She reports +FMs, No LOF, no VB, no blurry vision, headaches or peripheral edema, and RUQ pain.  She plans on breast feeding. She is unsure about contraceptive measure for birth control.  She received her prenatal care at [facility].   Dating: By IVF --->  Estimated Date of Delivery: 01/17/24  Sono:    @[redacted]w[redacted]d , CWD, normal anatomy, cephalic presentation, anterior placenta, 2864g, 98% EFW, >99% AC  ~3864g projected   Prenatal History/Complications:  - IVF - BMI of 42 - AMA - A2GDM - Hypothyroid in pregnancy - Anemia of pregnancy - Iron  deficiency anemia - Excessive fetal growth  Past Medical History: Past Medical History:  Diagnosis Date   Abnormal uterine bleeding 10/13/2020   Anxiety and depression    Biliary colic 04/10/2019   Abd US  03/2019 - cholelithiasis, fatty liver   Carpal tunnel syndrome 04/09/2017   Dermatitis 01/03/2021   Ectopic pregnancy 2016   Elevated blood pressure reading 01/31/2016   Fatty liver    Hypothyroidism    PCOS (polycystic ovarian syndrome)    Pelvic pain 10/13/2020   Type 2 diabetes mellitus (HCC)     Past Surgical History: Past Surgical History:  Procedure Laterality Date   CHOLECYSTECTOMY N/A 06/10/2020   Procedure: LAPAROSCOPIC CHOLECYSTECTOMY;  Surgeon: Jacolyn Matar, MD;  Location: [facility];  Service: General;  Laterality: N/A;   DILATION AND CURETTAGE OF UTERUS      Obstetrical History: OB History     Gravida  2   Para      Term      Preterm      AB  1   Living         SAB      IAB      Ectopic  1   Multiple      Live Births              Social History Social History   Socioeconomic History   Marital status: Married    Spouse name: Not on file   Number of children: Not on file   Years of education: Not on file   Highest education level: Associate  degree: occupational, Scientist, product/process development, or vocational program  Occupational History   Not on file  Tobacco Use   Smoking status: Former   Smokeless tobacco: Never  Vaping Use   Vaping status: Former   Start date: 06/04/2015   Substances: Nicotine  Substance and Sexual Activity   Alcohol use: Yes    Alcohol/week: 0.0 standard drinks of alcohol    Comment: soical   Drug use: Never   Sexual activity: Yes  Other Topics Concern   Not on file  Social History Narrative   Married.   Enjoys playing games, spending time with family.   Social Drivers of Corporate investment banker Strain: Low Risk  (11/18/2023)   Overall Financial Resource Strain (CARDIA)    Difficulty of Paying Living Expenses: Not very hard  Food Insecurity: No Food Insecurity (11/18/2023)   Hunger Vital Sign    Worried About Running Out of Food in the Last Year: Never true    Ran Out of Food in the Last Year: Never true  Transportation Needs: No Transportation Needs (11/18/2023)   PRAPARE - Administrator, Civil Service (Medical): No    Lack of Transportation (Non-Medical): No  Physical  Activity: Insufficiently Active (11/18/2023)   Exercise Vital Sign    Days of Exercise per Week: 7 days    Minutes of Exercise per Session: 20 min  Stress: Stress Concern Present (11/18/2023)   [employer] of Occupational Health - Occupational Stress Questionnaire    Feeling of Stress : To some extent  Social Connections: Moderately Isolated (11/18/2023)   Social Connection and Isolation Panel [NHANES]    Frequency of Communication with Friends and Family: More than three times a week    Frequency of Social Gatherings with Friends and Family: Once a week    Attends Religious Services: Never    Database administrator or Organizations: No    Attends Engineer, structural: Not on file    Marital Status: Married    Family History: Family History  Problem Relation Age of Onset   Diabetes Father    Hypertension  Father    Heart disease Father    Melanoma Father    Hyperlipidemia Mother    Diabetes Brother    Hypertension Brother    Ovarian cancer Paternal Aunt    Esophageal cancer Maternal Grandmother     Allergies: No Known Allergies  No medications prior to admission.     Review of Systems   All systems reviewed and negative except as stated in HPI  Last menstrual period 11/02/2022. General appearance: alert, cooperative, and no distress Lungs: clear to auscultation bilaterally Heart: regular rate and rhythm Abdomen: soft, non-tender; bowel sounds normal Pelvic: normal female genitalia Extremities: Homans sign is negative, no sign of DVT Presentation: cephalic Fetal monitoringBaseline: 135 bpm, Variability: Good {> 6 bpm), Accelerations: Reactive, and Decelerations: Absent Uterine activity: Uterine irritability with occasional, irregular contractions     Prenatal labs: ABO, Rh: B/Positive/-- (01/02 1026) Antibody: Negative (01/02 1026) Rubella: 1.04 (01/02 1026) RPR: Non Reactive (04/10 0938)  HBsAg: Negative (01/02 1026)  HIV: Non Reactive (04/10 0938)  GBS: Negative/-- (05/29 1440)    Lab Results  Component Value Date   GBS Negative 12/19/2023   GTT: Early HbgA1C 5.8 - treated as GDM Genetic screening:  PGD neg, LR female, AFP neg Anatomy US : Neg  Immunization History  Administered Date(s) Administered   Influenza, Seasonal, Injecte, Preservative Fre 09/18/2023   Influenza,inj,Quad PF,6+ Mos 09/12/2018, 04/10/2019, 07/05/2021   Moderna Sars-Covid-2 Vaccination 12/03/2019, 12/31/2019   Pneumococcal Polysaccharide-23 11/09/2021   Tdap 09/12/2018, 11/28/2023    Prenatal Transfer Tool  Maternal Diabetes: Yes:  Diabetes Type:  Insulin /Medication controlled Genetic Screening: Normal Maternal Ultrasounds/Referrals: Normal Fetal Ultrasounds or other Referrals:  None Maternal Substance Abuse:  No Significant Maternal Medications:  Meds include: Zoloft   Syntroid Significant Maternal Lab Results: Group B Strep negative Number of Prenatal Visits:greater than 3 verified prenatal visits Maternal Vaccinations:TDap, Flu, and Covid Other Comments:  None   No results found for this or any previous visit (from the past 24 hours).  Patient Active Problem List   Diagnosis Date Noted   Excessive fetal growth affecting management of mother in third trimester, antepartum 12/09/2023   Iron  deficiency anemia of mother during pregnancy 11/12/2023   Anemia in pregnancy, third trimester 11/05/2023   Hypothyroid in pregnancy, antepartum, third trimester 08/09/2023   GDM, class A2 08/08/2023   Pregnancy resulting from in vitro fertilization 07/25/2023   BMI 40.0-44.9, adult (HCC) 07/25/2023   Maternal morbid obesity, antepartum (HCC) 07/25/2023   Supervision of high-risk pregnancy 07/25/2023   Advanced maternal age >40 07/25/2023   Preventative health care 09/12/2018   Anxiety  and depression 01/31/2016    Assessment/Plan:  Emily Banks is a 43 y.o. G2P0010 at [redacted]w[redacted]d here for IOL due to A2GDM.  #Labor: On sterile cervical exam, patient presented with closed cervix. She was amenable to dual Cytotec (25 mcg vaginal and 50 mcg buccal) to help ripen cervix. Will recheck cervix progression in approximately 4 hours. #Pain: Per pt request #FWB: Cat I #GBS status:  negative #Feeding: Breastmilk  #Reproductive Life planning: Undecided #Circ:  not applicable  #Elevated BMI: BMI of 41.74 #A2GDM: @[redacted]w[redacted]d , CWD, normal anatomy, cephalic presentation, anterior placenta, 2864g, 98% EFW, >99% AC, ~3864g projected  - q4h CBG's - Continue home insulin  regimen with 50% reduction in Levemir  (26U to 13U at bedtime) to accommodate light laboring diet - Hypoglycemia protocol in place if needed.  #Hypothyroidism: Continue synthroid  88 mcg daily.  #Anxiety and Depression: Continue zoloft  100 mg daily.  Park Bolk, Medical Student  12/26/2023, 10:07  PM  Evaluation and management procedures were performed by the MS3 Lydia Sams under my supervision. I was immediately available for direct supervision, assistance and direction throughout this encounter.  I also confirm that I have verified the information documented in the student's note, and that I have also personally reperformed the pertinent components of the physical exam and all of the medical decision making activities.  I have also made any necessary editorial changes.   Candice Chalet, MD Family Medicine - Obstetrics Fellow

## 2023-12-26 NOTE — Progress Notes (Signed)
   PRENATAL VISIT NOTE  Subjective:  Emily Banks is a 43 y.o. G2P0010 at [redacted]w[redacted]d being seen today for ongoing prenatal care.  She is currently monitored for the following issues for this high-risk pregnancy and has Anxiety and depression; Preventative health care; Pregnancy resulting from in vitro fertilization; BMI 40.0-44.9, adult (HCC); Maternal morbid obesity, antepartum (HCC); Supervision of high-risk pregnancy; Advanced maternal age >35; GDM, class A2; Hypothyroid in pregnancy, antepartum, third trimester; Anemia in pregnancy, third trimester; Iron  deficiency anemia of mother during pregnancy; and Excessive fetal growth affecting management of mother in third trimester, antepartum on their problem list.  Patient reports no complaints.  Contractions: Not present. Vag. Bleeding: None.  Movement: Present. Denies leaking of fluid.   The following portions of the patient's history were reviewed and updated as appropriate: allergies, current medications, past family history, past medical history, past social history, past surgical history and problem list.   Objective:    Vitals:   12/26/23 1350  BP: (!) 144/86  Weight: 260 lb (117.9 kg)    Fetal Status:  Fetal Heart Rate (bpm): nst   Movement: Present    General: Alert, oriented and cooperative. Patient is in no acute distress.  Skin: Skin is warm and dry. No rash noted.   Cardiovascular: Normal heart rate noted  Respiratory: Normal respiratory effort, no problems with respiration noted  Abdomen: Soft, gravid, appropriate for gestational age.  Pain/Pressure: Absent     Pelvic: Cervical exam deferred        Extremities: Normal range of motion.     Mental Status: Normal mood and affect. Normal behavior. Normal judgment and thought content.  NST:  Baseline: 150 bpm, Variability: Good {> 6 bpm), Accelerations: Reactive, and Decelerations: Absent  Assessment and Plan:  Pregnancy: G2P0010 at [redacted]w[redacted]d 1. Hypothyroid in pregnancy,  antepartum, third trimester (Primary) On synthroid   2. GDM, class A2 Dexcom reivewed Avg 137 Time in range 84% Continue insulin  EFW > 98% BPP 8/8, NST reactive IOL scheduled fro tomorrow.  3. Supervision of high risk pregnancy in third trimester Continue prenatal care.  4. Pregnancy resulting from in vitro fertilization in third trimester   5. Iron  deficiency anemia of mother during pregnancy S/p infusions  6. Multigravida of advanced maternal age in third trimester Nml Preimplantation genetics  7. [redacted] weeks gestation of pregnancy   Preterm labor symptoms and general obstetric precautions including but not limited to vaginal bleeding, contractions, leaking of fluid and fetal movement were reviewed in detail with the patient. Please refer to After Visit Summary for other counseling recommendations.   Return in 1 week (on 01/02/2024).  Future Appointments  Date Time Provider Department Center  12/27/2023 12:00 AM [facility] SCHED ROOM [facility] None  01/01/2024  9:00 AM [facility] PROVIDER 1 [facility]  01/01/2024  9:30 AM [facility] US4 [facility]    Granville Layer, MD

## 2023-12-26 NOTE — Patient Instructions (Signed)
[email or web address]

## 2023-12-27 ENCOUNTER — Inpatient Hospital Stay (HOSPITAL_COMMUNITY)
Admission: RE | Admit: 2023-12-27 | Discharge: 2024-01-01 | DRG: 787 | Disposition: A | Discharge status: Home / Self Care | Attending: Family Medicine | Admitting: Family Medicine

## 2023-12-27 ENCOUNTER — Inpatient Hospital Stay (HOSPITAL_COMMUNITY)

## 2023-12-27 ENCOUNTER — Encounter (HOSPITAL_COMMUNITY): Payer: Self-pay | Admitting: Obstetrics and Gynecology

## 2023-12-27 ENCOUNTER — Other Ambulatory Visit: Payer: Self-pay

## 2023-12-27 DIAGNOSIS — Z8249 Family history of ischemic heart disease and other diseases of the circulatory system: Secondary | ICD-10-CM | POA: Diagnosis not present

## 2023-12-27 DIAGNOSIS — O99214 Obesity complicating childbirth: Secondary | ICD-10-CM | POA: Diagnosis present

## 2023-12-27 DIAGNOSIS — Z79899 Other long term (current) drug therapy: Secondary | ICD-10-CM

## 2023-12-27 DIAGNOSIS — O09523 Supervision of elderly multigravida, third trimester: Secondary | ICD-10-CM | POA: Diagnosis not present

## 2023-12-27 DIAGNOSIS — O24424 Gestational diabetes mellitus in childbirth, insulin controlled: Secondary | ICD-10-CM | POA: Diagnosis not present

## 2023-12-27 DIAGNOSIS — Z87891 Personal history of nicotine dependence: Secondary | ICD-10-CM | POA: Diagnosis not present

## 2023-12-27 DIAGNOSIS — E039 Hypothyroidism, unspecified: Secondary | ICD-10-CM | POA: Diagnosis not present

## 2023-12-27 DIAGNOSIS — Z6841 Body Mass Index (BMI) 40.0 and over, adult: Secondary | ICD-10-CM

## 2023-12-27 DIAGNOSIS — F32A Depression, unspecified: Secondary | ICD-10-CM | POA: Diagnosis present

## 2023-12-27 DIAGNOSIS — D62 Acute posthemorrhagic anemia: Secondary | ICD-10-CM | POA: Diagnosis not present

## 2023-12-27 DIAGNOSIS — O99013 Anemia complicating pregnancy, third trimester: Secondary | ICD-10-CM | POA: Diagnosis present

## 2023-12-27 DIAGNOSIS — O99210 Obesity complicating pregnancy, unspecified trimester: Secondary | ICD-10-CM | POA: Diagnosis present

## 2023-12-27 DIAGNOSIS — O9081 Anemia of the puerperium: Secondary | ICD-10-CM | POA: Diagnosis not present

## 2023-12-27 DIAGNOSIS — O3663X0 Maternal care for excessive fetal growth, third trimester, not applicable or unspecified: Secondary | ICD-10-CM | POA: Diagnosis present

## 2023-12-27 DIAGNOSIS — O99344 Other mental disorders complicating childbirth: Secondary | ICD-10-CM | POA: Diagnosis not present

## 2023-12-27 DIAGNOSIS — Z79890 Hormone replacement therapy (postmenopausal): Secondary | ICD-10-CM

## 2023-12-27 DIAGNOSIS — Z3A00 Weeks of gestation of pregnancy not specified: Secondary | ICD-10-CM | POA: Diagnosis not present

## 2023-12-27 DIAGNOSIS — Z833 Family history of diabetes mellitus: Secondary | ICD-10-CM

## 2023-12-27 DIAGNOSIS — E1165 Type 2 diabetes mellitus with hyperglycemia: Secondary | ICD-10-CM

## 2023-12-27 DIAGNOSIS — E66813 Obesity, class 3: Secondary | ICD-10-CM | POA: Diagnosis present

## 2023-12-27 DIAGNOSIS — O99284 Endocrine, nutritional and metabolic diseases complicating childbirth: Secondary | ICD-10-CM | POA: Diagnosis present

## 2023-12-27 DIAGNOSIS — Z3A36 36 weeks gestation of pregnancy: Secondary | ICD-10-CM

## 2023-12-27 DIAGNOSIS — O09819 Supervision of pregnancy resulting from assisted reproductive technology, unspecified trimester: Secondary | ICD-10-CM

## 2023-12-27 DIAGNOSIS — O99283 Endocrine, nutritional and metabolic diseases complicating pregnancy, third trimester: Secondary | ICD-10-CM | POA: Diagnosis present

## 2023-12-27 DIAGNOSIS — O24419 Gestational diabetes mellitus in pregnancy, unspecified control: Principal | ICD-10-CM

## 2023-12-27 DIAGNOSIS — D509 Iron deficiency anemia, unspecified: Secondary | ICD-10-CM | POA: Diagnosis present

## 2023-12-27 LAB — CBC
HCT: 32.2 % — ABNORMAL LOW (ref 36.0–46.0)
Hemoglobin: 11 g/dL — ABNORMAL LOW (ref 12.0–15.0)
MCH: 28.9 pg (ref 26.0–34.0)
MCHC: 34.2 g/dL (ref 30.0–36.0)
MCV: 84.5 fL (ref 80.0–100.0)
Platelets: 213 10*3/uL (ref 150–400)
RBC: 3.81 MIL/uL — ABNORMAL LOW (ref 3.87–5.11)
RDW: 15.1 % (ref 11.5–15.5)
WBC: 9.8 10*3/uL (ref 4.0–10.5)
nRBC: 0 % (ref 0.0–0.2)

## 2023-12-27 LAB — COMPREHENSIVE METABOLIC PANEL WITH GFR
ALT: 19 U/L (ref 0–44)
AST: 27 U/L (ref 15–41)
Albumin: 2.7 g/dL — ABNORMAL LOW (ref 3.5–5.0)
Alkaline Phosphatase: 174 U/L — ABNORMAL HIGH (ref 38–126)
Anion gap: 11 (ref 5–15)
BUN: 8 mg/dL (ref 6–20)
CO2: 17 mmol/L — ABNORMAL LOW (ref 22–32)
Calcium: 9.8 mg/dL (ref 8.9–10.3)
Chloride: 108 mmol/L (ref 98–111)
Creatinine, Ser: 0.69 mg/dL (ref 0.44–1.00)
GFR, Estimated: >60 mL/min (ref >60)
Glucose, Bld: 90 mg/dL (ref 70–99)
Potassium: 3.7 mmol/L (ref 3.5–5.1)
Sodium: 136 mmol/L (ref 135–145)
Total Bilirubin: 0.4 mg/dL (ref 0.0–1.2)
Total Protein: 6.1 g/dL — ABNORMAL LOW (ref 6.5–8.1)

## 2023-12-27 LAB — TYPE AND SCREEN
ABO/RH(D): B POS
Antibody Screen: NEGATIVE

## 2023-12-27 LAB — PROTEIN / CREATININE RATIO, URINE
Creatinine, Urine: 52 mg/dL
Protein Creatinine Ratio: 0.21 mg/mg{creat} — ABNORMAL HIGH (ref 0.00–0.15)
Total Protein, Urine: 11 mg/dL

## 2023-12-27 LAB — GLUCOSE, CAPILLARY
Glucose-Capillary: 95 mg/dL (ref 70–99)
Glucose-Capillary: 80 mg/dL (ref 70–99)

## 2023-12-27 LAB — RPR: RPR Ser Ql: NONREACTIVE

## 2023-12-27 MED ORDER — INSULIN GLARGINE-YFGN 100 UNIT/ML ~~LOC~~ SOLN
13.0000 [IU] | Freq: Every day | SUBCUTANEOUS | Status: DC
  Administered 2023-12-27: 13 [IU] via SUBCUTANEOUS
  Filled 2023-12-27 (×2): qty 0.13

## 2023-12-27 MED ORDER — INSULIN GLARGINE-YFGN 100 UNIT/ML ~~LOC~~ SOLN
13.0000 [IU] | Freq: Every day | SUBCUTANEOUS | Status: DC
  Filled 2023-12-27: qty 0.13

## 2023-12-27 MED ORDER — MISOPROSTOL 50MCG HALF TABLET
50.0000 ug | ORAL_TABLET | ORAL | Status: DC | PRN
  Administered 2023-12-27 (×5): 50 ug via BUCCAL
  Filled 2023-12-27 (×4): qty 1

## 2023-12-27 MED ORDER — LEVOTHYROXINE SODIUM 88 MCG PO TABS
88.0000 ug | ORAL_TABLET | Freq: Every morning | ORAL | Status: DC
  Filled 2023-12-27: qty 1

## 2023-12-27 MED ORDER — LEVOTHYROXINE SODIUM 88 MCG PO TABS
88.0000 ug | ORAL_TABLET | Freq: Every morning | ORAL | Status: DC
  Administered 2023-12-27 – 2023-12-29 (×3): 88 ug via ORAL
  Filled 2023-12-27 (×3): qty 1

## 2023-12-27 MED ORDER — TERBUTALINE SULFATE 1 MG/ML IJ SOLN: 0.2500 mg | Freq: Once | INTRAMUSCULAR | Status: DC | PRN

## 2023-12-27 MED ORDER — MISOPROSTOL 25 MCG QUARTER TABLET
25.0000 ug | ORAL_TABLET | Freq: Once | ORAL | Status: AC
  Administered 2023-12-27: 25 ug via VAGINAL

## 2023-12-27 MED ORDER — FENTANYL CITRATE (PF) 100 MCG/2ML IJ SOLN
50.0000 ug | INTRAMUSCULAR | Status: AC | PRN
  Administered 2023-12-27 – 2023-12-28 (×2): 50 ug via INTRAVENOUS
  Filled 2023-12-27 (×2): qty 2

## 2023-12-27 MED ORDER — LIDOCAINE HCL (PF) 1 % IJ SOLN: 30.0000 mL | INTRAMUSCULAR | Status: DC | PRN

## 2023-12-27 MED ORDER — SERTRALINE HCL 100 MG PO TABS
100.0000 mg | ORAL_TABLET | Freq: Every day | ORAL | Status: DC
  Administered 2023-12-27 (×2): 100 mg via ORAL
  Filled 2023-12-27 (×6): qty 1

## 2023-12-27 MED ORDER — MISOPROSTOL 50MCG HALF TABLET
50.0000 ug | ORAL_TABLET | Freq: Once | ORAL | Status: AC
  Administered 2023-12-27: 50 ug via BUCCAL

## 2023-12-27 MED ORDER — ONDANSETRON HCL 4 MG/2ML IJ SOLN
4.0000 mg | Freq: Four times a day (QID) | INTRAMUSCULAR | Status: DC | PRN
  Administered 2023-12-29: 4 mg via INTRAVENOUS
  Filled 2023-12-27 (×2): qty 2

## 2023-12-27 MED ORDER — OXYTOCIN-SODIUM CHLORIDE 30-0.9 UT/500ML-% IV SOLN
2.5000 [IU]/h | INTRAVENOUS | Status: DC
  Administered 2023-12-29: 59.94 [IU]/h via INTRAVENOUS
  Administered 2023-12-29: 18 [IU]/h via INTRAVENOUS

## 2023-12-27 MED ORDER — ACETAMINOPHEN 325 MG PO TABS
650.0000 mg | ORAL_TABLET | ORAL | Status: DC | PRN
Start: 2023-12-27 — End: 2023-12-29
  Administered 2023-12-27 – 2023-12-28 (×2): 650 mg via ORAL
  Filled 2023-12-27 (×2): qty 2

## 2023-12-27 MED ORDER — INSULIN ASPART 100 UNIT/ML IJ SOLN: 0.0000 [IU] | INTRAMUSCULAR | Status: DC

## 2023-12-27 MED ORDER — OXYTOCIN BOLUS FROM INFUSION: 333.0000 mL | Freq: Once | INTRAVENOUS | Status: DC

## 2023-12-27 MED ORDER — INSULIN DETEMIR 100 UNIT/ML FLEXPEN: 13.0000 [IU] | SUBCUTANEOUS | Status: DC

## 2023-12-27 MED ORDER — SOD CITRATE-CITRIC ACID 500-334 MG/5ML PO SOLN
30.0000 mL | ORAL | Status: DC | PRN
  Administered 2023-12-29: 30 mL via ORAL
  Filled 2023-12-27: qty 30

## 2023-12-27 MED ORDER — LEVOTHYROXINE SODIUM 88 MCG PO TABS
88.0000 ug | ORAL_TABLET | Freq: Every day | ORAL | Status: DC
  Filled 2023-12-27: qty 1

## 2023-12-27 MED ORDER — INSULIN ASPART 100 UNIT/ML IJ SOLN: 0.0000 [IU] | Freq: Three times a day (TID) | INTRAMUSCULAR | Status: DC

## 2023-12-27 MED ORDER — LACTATED RINGERS IV SOLN: 500.0000 mL | INTRAVENOUS | Status: AC | PRN

## 2023-12-27 MED ORDER — LACTATED RINGERS IV SOLN: INTRAVENOUS | Status: AC

## 2023-12-27 NOTE — Inpatient Diabetes Management (Signed)
 Inpatient Diabetes Program Recommendations  AACE/ADA: New Consensus Statement on Inpatient Glycemic Control (2015)  Target Ranges:  Prepandial:   less than 140 mg/dL      Peak postprandial:   less than 180 mg/dL (1-2 hours)      Critically ill patients:  140 - 180 mg/dL   Lab Results  Component Value Date   GLUCAP 95 12/27/2023   HGBA1C 5.5 12/10/2023    Review of Glycemic Control  Latest Reference Range & Units 12/27/23 01:47  Glucose-Capillary 70 - 99 mg/dL 95    Diabetes history: GDM Outpatient Diabetes medications: Levemir  26 units every day, Metformin  1 g bid, Dexcom G7 Current orders for Inpatient glycemic control: Semglee  13 units QD  Inpatient Diabetes Program Recommendations:    IOL  Please discontinue Semglee  for IOL and add Novolog  0-14 units Q4h.  Thank you, Hays Lipschutz, MSN, CDCES Diabetes Coordinator Inpatient Diabetes Program [phone] (team pager from 8a-5p)

## 2023-12-27 NOTE — Progress Notes (Signed)
 Labor Progress Note Emily Banks is a 43 y.o. G2P0010 at [redacted]w[redacted]d presented for IOL due to A2GDM  S: Comfortable at bedside, no questions at this time.  O:  BP 123/72   Pulse 61   Temp 98.1 F (36.7 C) (Oral)   Resp 16   Ht 5\' 6"  (1.676 m)   Wt 117.3 kg   LMP 11/02/2022 (Exact Date)   SpO2 99%   BMI 41.74 kg/m  EFM: 130/mod/+a/occ variables  CVE: Dilation: Closed Effacement (%): Thick Cervical Position: Posterior Station: -3 Presentation: Vertex (Confirmed BSUS) Exam by:: Adline Hook, RN   A&P: 42 y.o. G2P0010 [redacted]w[redacted]d here for IOL  #Labor: S/p Cytotec x 2 doses, will recheck cx at 4 hour mark and assess for placement of FB #Pain: Per pt request #FWB: Cat II, overall reassuring #GBS negative  #A2GDM: CBGs ok  LGA infant, shoulder precautions  #BMI 41  #AMA   #IVF pregnancy  #Hypothyroidism: Last TSH wnl 2 wk ago  Melanie Spires, MD 11:36 AM

## 2023-12-27 NOTE — Progress Notes (Signed)
 Labor Progress Note  Emily Banks is a 43 y.o. G2P0010 at [redacted]w[redacted]d presented for IOL due to A2GDM. At bedside to recheck cx -- cx now FT, approx 50% effaced, still high. Cat I tracing. Will give additional dose of Cytotec 50mcg buccal and re-eval for Cook vs FB at next check. Per bedside RN, patient's partner feels discouraged by minimal cervical change. Discussed slow process of IOL, esp for essential primip at 37w, pt reassured. Plan to recheck cx in 4 hours to re-eval for FB placement.    Melanie Spires, MD 7:25 PM

## 2023-12-28 ENCOUNTER — Inpatient Hospital Stay (HOSPITAL_COMMUNITY): Admitting: Anesthesiology

## 2023-12-28 LAB — CBC
HCT: 35.9 % — ABNORMAL LOW (ref 36.0–46.0)
Hemoglobin: 12.3 g/dL (ref 12.0–15.0)
MCH: 28.9 pg (ref 26.0–34.0)
MCHC: 34.3 g/dL (ref 30.0–36.0)
MCV: 84.5 fL (ref 80.0–100.0)
Platelets: 214 10*3/uL (ref 150–400)
RBC: 4.25 MIL/uL (ref 3.87–5.11)
RDW: 14.7 % (ref 11.5–15.5)
WBC: 13.3 10*3/uL — ABNORMAL HIGH (ref 4.0–10.5)
nRBC: 0 % (ref 0.0–0.2)

## 2023-12-28 MED ORDER — FENTANYL-BUPIVACAINE-NACL 0.5-0.125-0.9 MG/250ML-% EP SOLN
12.0000 mL/h | EPIDURAL | Status: DC | PRN
  Administered 2023-12-28 – 2023-12-29 (×2): 12 mL/h via EPIDURAL
  Filled 2023-12-28 (×2): qty 250

## 2023-12-28 MED ORDER — FENTANYL CITRATE (PF) 100 MCG/2ML IJ SOLN
100.0000 ug | INTRAMUSCULAR | Status: DC | PRN
  Administered 2023-12-28 (×3): 100 ug via INTRAVENOUS
  Filled 2023-12-28 (×3): qty 2

## 2023-12-28 MED ORDER — PHENYLEPHRINE 80 MCG/ML (10ML) SYRINGE FOR IV PUSH (FOR BLOOD PRESSURE SUPPORT)
80.0000 ug | PREFILLED_SYRINGE | INTRAVENOUS | Status: DC | PRN
  Filled 2023-12-28: qty 10

## 2023-12-28 MED ORDER — LACTATED RINGERS IV SOLN: 500.0000 mL | INTRAVENOUS | Status: DC | PRN

## 2023-12-28 MED ORDER — EPHEDRINE 5 MG/ML INJ: 10.0000 mg | INTRAVENOUS | Status: DC | PRN

## 2023-12-28 MED ORDER — OXYTOCIN-SODIUM CHLORIDE 30-0.9 UT/500ML-% IV SOLN
1.0000 m[IU]/min | INTRAVENOUS | Status: DC
  Administered 2023-12-28 – 2023-12-29 (×2): 2 m[IU]/min via INTRAVENOUS
  Filled 2023-12-28 (×2): qty 500

## 2023-12-28 MED ORDER — DIPHENHYDRAMINE HCL 50 MG/ML IJ SOLN: 12.5000 mg | INTRAMUSCULAR | Status: DC | PRN

## 2023-12-28 MED ORDER — PHENYLEPHRINE 80 MCG/ML (10ML) SYRINGE FOR IV PUSH (FOR BLOOD PRESSURE SUPPORT)
80.0000 ug | PREFILLED_SYRINGE | INTRAVENOUS | Status: DC | PRN
  Administered 2023-12-28: 160 ug via INTRAVENOUS

## 2023-12-28 MED ORDER — MISOPROSTOL 50MCG HALF TABLET
50.0000 ug | ORAL_TABLET | ORAL | Status: DC | PRN
  Administered 2023-12-28: 50 ug via VAGINAL
  Filled 2023-12-28: qty 1

## 2023-12-28 MED ORDER — LACTATED RINGERS IV SOLN: INTRAVENOUS | Status: DC

## 2023-12-28 MED ORDER — LACTATED RINGERS IV SOLN
500.0000 mL | Freq: Once | INTRAVENOUS | Status: AC
  Administered 2023-12-28: 500 mL via INTRAVENOUS

## 2023-12-28 MED ORDER — INSULIN GLARGINE-YFGN 100 UNIT/ML ~~LOC~~ SOLN
13.0000 [IU] | Freq: Every day | SUBCUTANEOUS | Status: DC
  Administered 2023-12-28 (×2): 13 [IU] via SUBCUTANEOUS
  Filled 2023-12-28 (×3): qty 0.13

## 2023-12-28 MED ORDER — TERBUTALINE SULFATE 1 MG/ML IJ SOLN
0.2500 mg | Freq: Once | INTRAMUSCULAR | Status: DC | PRN
Start: 2023-12-28 — End: 2023-12-29

## 2023-12-28 MED ORDER — LIDOCAINE HCL (PF) 1 % IJ SOLN
INTRAMUSCULAR | Status: DC | PRN
  Administered 2023-12-28 (×2): 4 mL via EPIDURAL

## 2023-12-28 NOTE — Progress Notes (Signed)
 Labor progress note:  Having difficulty tracing ctx when pt is not flat on her back. Ideally would have patient repositioned frequently, esp given ihihg suspicion that baby is OP. Discussed placement of IUPC w pt and she provided verbal consent. IUPC placed w/o issue. Cx now 4/50/-3. Will start Pit 2x2. Cat I tracing now, did have prolonged decel earlier after epidural, better after a dose of Phenylephrine . Has had some mild to mod range BP, asymptomatic. CBG have been ok.   Melanie Spires, MD OB Fellow, Faculty Practice [facility], Center for [facility]

## 2023-12-28 NOTE — Anesthesia Preprocedure Evaluation (Signed)
 Anesthesia Evaluation  Patient identified by MRN, date of birth, ID band Patient awake    Reviewed: Allergy & Precautions, NPO status , Patient's Chart, lab work & pertinent test results  History of Anesthesia Complications Negative for: history of anesthetic complications  Airway Mallampati: III  TM Distance: >3 FB Neck ROM: Full    Dental   Pulmonary former smoker   Pulmonary exam normal breath sounds clear to auscultation       Cardiovascular negative cardio ROS  Rhythm:Regular Rate:Normal     Neuro/Psych  PSYCHIATRIC DISORDERS Anxiety Depression    negative neurological ROS     GI/Hepatic negative GI ROS,,,Fatty liver   Endo/Other  diabetes, Gestational, Insulin  Dependent, Oral Hypoglycemic AgentsHypothyroidism  Class 3 obesity  Renal/GU negative Renal ROS     Musculoskeletal   Abdominal  (+) + obese  Peds  Hematology negative hematology ROS (+) Lab Results      Component                Value               Date                      WBC                      13.3 (H)            12/28/2023                HGB                      12.3                12/28/2023                HCT                      35.9 (L)            12/28/2023                MCV                      84.5                12/28/2023                PLT                      214                 12/28/2023              Anesthesia Other Findings   Reproductive/Obstetrics (+) Pregnancy                             Anesthesia Physical Anesthesia Plan  ASA: 3  Anesthesia Plan: Epidural   Post-op Pain Management:    Induction:   PONV Risk Score and Plan:   Airway Management Planned: Natural Airway  Additional Equipment:   Intra-op Plan:   Post-operative Plan:   Informed Consent: I have reviewed the patients History and Physical, chart, labs and discussed the procedure including the risks, benefits and  alternatives for the proposed anesthesia with the patient or authorized representative who has indicated his/her understanding and acceptance.  Plan Discussed with: Anesthesiologist  Anesthesia Plan Comments: (I have discussed risks of neuraxial anesthesia including but not limited to infection, bleeding, nerve injury, back pain, headache, seizures, and failure of block. Patient denies bleeding disorders and is not currently anticoagulated. Labs have been reviewed. Risks and benefits discussed. All patient's questions answered.  )       Anesthesia Quick Evaluation

## 2023-12-28 NOTE — Progress Notes (Signed)
 OB/GYN Faculty Practice: Labor Progress Note  Subjective: Resting comfortably in bed.  Objective: BP (!) 154/87   Pulse 61   Temp 97.8 F (36.6 C) (Oral)   Resp 16   Ht 5\' 6"  (1.676 m)   Wt 117.3 kg   LMP 11/02/2022 (Exact Date)   SpO2 100%   BMI 41.74 kg/m   Gen: no acute complaints Dilation: 1.5 Effacement (%): 30 Cervical Position: Posterior Station: -3 Presentation: Vertex Exam by:: Dr. Mellie Sprinkle  SVEDebrah Fan balloon placed, 40cc  Assessment and Plan: 43 y.o. G2P0010 [redacted]w[redacted]d for IOL due to GDMA2  Labor: Cook in place, continue Pit per protocol -- pain control: IV or epidural upon request  Fetal Well-Being:  -- Category I - continuous fetal monitoring  -- GBS (neg)   Jenny Michalla Ringer, DO 11:01 AM

## 2023-12-28 NOTE — Progress Notes (Addendum)
 Nursing spoke with provider that semglee  wasn't ordered as previously ordered for 50% reduction of home regimen given CLD.  Reviewed orders and glucose levels and appears to be controlled on the previously ordered 13U at bedtime.  Reordered the 13U Semglee  at bedtime and canceled SSI regimen as if patient repeat >120 on CBGs will place on Endotool to avoid infantile hypoglycemia.   Candice Chalet, MD Family Medicine - Obstetrics Fellow

## 2023-12-28 NOTE — Anesthesia Procedure Notes (Signed)
 Epidural Patient location during procedure: OB Start time: 12/28/2023 4:17 PM End time: 12/28/2023 4:17 PM  Staffing Anesthesiologist: Conard Decent, MD Performed: anesthesiologist   Preanesthetic Checklist Completed: patient identified, IV checked, site marked, risks and benefits discussed, surgical consent, monitors and equipment checked, pre-op evaluation and timeout performed  Epidural Patient position: sitting Prep: DuraPrep and site prepped and draped Patient monitoring: continuous pulse ox and blood pressure Approach: midline Location: L3-L4 Injection technique: LOR saline  Needle:  Needle type: Tuohy  Needle gauge: 17 G Needle length: 9 cm and 9 Needle insertion depth: 9 cm Catheter type: closed end flexible Catheter size: 19 Gauge Catheter at skin depth: 14 cm Test dose: negative  Assessment Events: blood not aspirated, no cerebrospinal fluid, injection not painful, no injection resistance, no paresthesia and negative IV test  Additional Notes The patient has requested an epidural for labor pain management. Risks and benefits including, but not limited to, infection, bleeding, local anesthetic toxicity, headache, hypotension, back pain, block failure, etc. were discussed with the patient. The patient expressed understanding and consented to the procedure. I confirmed that the patient has no bleeding disorders and is not taking blood thinners. I confirmed the patient's last platelet count with the nurse. A time-out was performed immediately prior to the procedure. Please see nursing documentation for vital signs. Sterile technique was used throughout the whole procedure. Once LOR achieved, the epidural catheter threaded easily without resistance. Aspiration of the catheter was negative for blood and CSF. The epidural was dosed slowly and an infusion was started.  4 attempt(s)Reason for block:procedure for pain

## 2023-12-28 NOTE — Progress Notes (Signed)
 LABOR PROGRESS NOTE  Patient Name: Emily Banks, female   DOB: 1981-01-23, 43 y.o.  MRN: [id number]  Cat 1 strip, agreeable to attempt at [facility].  Unsuccessful under spec guidance.  Notable for dense mucous plug starting to remove.  Redose cytotec .  Plan to reassess in 4 hrs.   Ebony Goldstein, MD

## 2023-12-28 NOTE — Progress Notes (Signed)
 Labor Progress Note Emily Banks is a 43 y.o. G2P0010 at [redacted]w[redacted]d presented for IOL due to A2GDM  S: Comfortable at bedside, wondering about next steps, FB just came out.  O:  BP (!) 147/74   Pulse (!) 59   Temp 98.4 F (36.9 C) (Oral)   Resp 16   Ht 5\' 6"  (1.676 m)   Wt 117.3 kg   LMP 11/02/2022 (Exact Date)   SpO2 100%   BMI 41.74 kg/m  EFM: 130/mod/+a/-d  CVE: Dilation: 3 Effacement (%): 50 Cervical Position: Posterior Station: -3 Presentation: Vertex Exam by:: Dr. Hubert Madden   A&P: 43 y.o. G2P0010 [redacted]w[redacted]d here for IOL due to A2GDM #Labor: Progressing well. S/p FB. Discussed role of AROM w pt in detail, pt provided verbal consent, AROM performed w mod amount of clear/bloody fluid. Pt and baby tolerated well. Will eval pt's contraction pattern and if not having regular and strong ctx in 54min-1h, will start Pitocin  2x2 #Pain: Desires epidural #FWB: Cat I #GBS negative  #A2GDM: CBGs ok  LGA infant, shoulder precautions   #BMI 41   #AMA    #IVF pregnancy   #Hypothyroidism: Last TSH wnl 2 wk ago  Melanie Spires, MD 3:00 PM

## 2023-12-28 NOTE — Progress Notes (Signed)
 LABOR PROGRESS NOTE  Patient Name: Emily Banks, female   DOB: 1981/01/25, 43 y.o.  MRN: [id number]  Cat 1 strip. CVE showing tight FT and what appears to be external os. Re-attempt Cook catheter placement after counseling, unsuccessful.  Digital attempted as well, unsuccessful.  Repeated Cytotec  but changing route to vaginal to see if better ripening results.    Ebony Goldstein, MD

## 2023-12-29 ENCOUNTER — Encounter (HOSPITAL_COMMUNITY)
Admission: RE | Disposition: A | Discharge status: Home / Self Care | Payer: Self-pay | Source: Home / Self Care | Attending: Family Medicine

## 2023-12-29 ENCOUNTER — Encounter (HOSPITAL_COMMUNITY): Payer: Self-pay | Admitting: Obstetrics and Gynecology

## 2023-12-29 ENCOUNTER — Other Ambulatory Visit: Payer: Self-pay

## 2023-12-29 DIAGNOSIS — O99344 Other mental disorders complicating childbirth: Secondary | ICD-10-CM

## 2023-12-29 DIAGNOSIS — Z3A36 36 weeks gestation of pregnancy: Secondary | ICD-10-CM

## 2023-12-29 DIAGNOSIS — O24424 Gestational diabetes mellitus in childbirth, insulin controlled: Secondary | ICD-10-CM

## 2023-12-29 DIAGNOSIS — O3663X0 Maternal care for excessive fetal growth, third trimester, not applicable or unspecified: Secondary | ICD-10-CM

## 2023-12-29 DIAGNOSIS — O99284 Endocrine, nutritional and metabolic diseases complicating childbirth: Secondary | ICD-10-CM

## 2023-12-29 DIAGNOSIS — O09523 Supervision of elderly multigravida, third trimester: Secondary | ICD-10-CM

## 2023-12-29 LAB — GLUCOSE, CAPILLARY
Glucose-Capillary: 100 mg/dL — ABNORMAL HIGH (ref 70–99)
Glucose-Capillary: 138 mg/dL — ABNORMAL HIGH (ref 70–99)

## 2023-12-29 SURGERY — Surgical Case
Anesthesia: Epidural

## 2023-12-29 MED ORDER — FENTANYL CITRATE (PF) 100 MCG/2ML IJ SOLN: 25.0000 ug | INTRAMUSCULAR | Status: DC | PRN

## 2023-12-29 MED ORDER — SCOPOLAMINE 1 MG/3DAYS TD PT72
1.0000 | MEDICATED_PATCH | Freq: Once | TRANSDERMAL | Status: AC
Start: 2023-12-29 — End: 2024-01-01
  Administered 2023-12-29: 1.5 mg via TRANSDERMAL
  Filled 2023-12-29: qty 1

## 2023-12-29 MED ORDER — KETOROLAC TROMETHAMINE 30 MG/ML IJ SOLN: 30.0000 mg | Freq: Once | INTRAMUSCULAR | Status: DC | PRN

## 2023-12-29 MED ORDER — LACTATED RINGERS IV SOLN: INTRAVENOUS | Status: DC

## 2023-12-29 MED ORDER — ONDANSETRON HCL 4 MG/2ML IJ SOLN
INTRAMUSCULAR | Status: AC
Start: 2023-12-29 — End: ?
  Filled 2023-12-29: qty 2

## 2023-12-29 MED ORDER — SOD CITRATE-CITRIC ACID 500-334 MG/5ML PO SOLN: 30.0000 mL | ORAL | Status: DC

## 2023-12-29 MED ORDER — ENOXAPARIN SODIUM 40 MG/0.4ML IJ SOSY: 40.0000 mg | PREFILLED_SYRINGE | INTRAMUSCULAR | Status: DC

## 2023-12-29 MED ORDER — KETOROLAC TROMETHAMINE 30 MG/ML IJ SOLN: 30.0000 mg | Freq: Four times a day (QID) | INTRAMUSCULAR | Status: DC | PRN

## 2023-12-29 MED ORDER — SIMETHICONE 80 MG PO CHEW: 80.0000 mg | CHEWABLE_TABLET | ORAL | Status: DC | PRN

## 2023-12-29 MED ORDER — OXYTOCIN-SODIUM CHLORIDE 30-0.9 UT/500ML-% IV SOLN
1.0000 m[IU]/min | INTRAVENOUS | Status: DC
  Administered 2023-12-29: 34 m[IU]/min via INTRAVENOUS

## 2023-12-29 MED ORDER — SODIUM CHLORIDE 0.9% FLUSH: 3.0000 mL | INTRAVENOUS | Status: DC | PRN

## 2023-12-29 MED ORDER — PHENYLEPHRINE 80 MCG/ML (10ML) SYRINGE FOR IV PUSH (FOR BLOOD PRESSURE SUPPORT)
PREFILLED_SYRINGE | INTRAVENOUS | Status: DC | PRN
  Administered 2023-12-29: 80 ug via INTRAVENOUS

## 2023-12-29 MED ORDER — TRANEXAMIC ACID-NACL 1000-0.7 MG/100ML-% IV SOLN
INTRAVENOUS | Status: DC | PRN
  Administered 2023-12-29: 1000 mg via INTRAVENOUS

## 2023-12-29 MED ORDER — SIMETHICONE 80 MG PO CHEW
80.0000 mg | CHEWABLE_TABLET | Freq: Three times a day (TID) | ORAL | Status: DC
  Administered 2023-12-30 – 2024-01-01 (×7): 80 mg via ORAL
  Filled 2023-12-29 (×8): qty 1

## 2023-12-29 MED ORDER — NALOXONE HCL 4 MG/10ML IJ SOLN: 1.0000 ug/kg/h | INTRAVENOUS | Status: DC | PRN

## 2023-12-29 MED ORDER — TERBUTALINE SULFATE 1 MG/ML IJ SOLN: 0.2500 mg | Freq: Once | INTRAMUSCULAR | Status: DC | PRN

## 2023-12-29 MED ORDER — NALOXONE HCL 0.4 MG/ML IJ SOLN: 0.4000 mg | INTRAMUSCULAR | Status: DC | PRN

## 2023-12-29 MED ORDER — FENTANYL CITRATE (PF) 100 MCG/2ML IJ SOLN
INTRAMUSCULAR | Status: DC | PRN
  Administered 2023-12-29: 100 ug via EPIDURAL

## 2023-12-29 MED ORDER — SODIUM CHLORIDE 0.9 % IR SOLN
Status: DC | PRN
  Administered 2023-12-29: 1

## 2023-12-29 MED ORDER — OXYTOCIN-SODIUM CHLORIDE 30-0.9 UT/500ML-% IV SOLN
2.5000 [IU]/h | INTRAVENOUS | Status: AC
  Administered 2023-12-29: 2.5 [IU]/h via INTRAVENOUS
  Filled 2023-12-29: qty 500

## 2023-12-29 MED ORDER — FENTANYL CITRATE (PF) 100 MCG/2ML IJ SOLN
INTRAMUSCULAR | Status: AC
  Filled 2023-12-29: qty 2

## 2023-12-29 MED ORDER — COCONUT OIL OIL: 1.0000 | TOPICAL_OIL | Status: DC | PRN

## 2023-12-29 MED ORDER — LEVOTHYROXINE SODIUM 88 MCG PO TABS
88.0000 ug | ORAL_TABLET | Freq: Every day | ORAL | Status: DC
  Administered 2023-12-30 – 2024-01-01 (×3): 88 ug via ORAL
  Filled 2023-12-29 (×3): qty 1

## 2023-12-29 MED ORDER — LACTATED RINGERS IV SOLN: INTRAVENOUS | Status: DC | PRN

## 2023-12-29 MED ORDER — DIPHENHYDRAMINE HCL 25 MG PO CAPS: 25.0000 mg | ORAL_CAPSULE | Freq: Four times a day (QID) | ORAL | Status: DC | PRN

## 2023-12-29 MED ORDER — WITCH HAZEL-GLYCERIN EX PADS: 1.0000 | MEDICATED_PAD | CUTANEOUS | Status: DC | PRN

## 2023-12-29 MED ORDER — SODIUM CHLORIDE 0.9 % IV SOLN
500.0000 mg | INTRAVENOUS | Status: DC
  Filled 2023-12-29: qty 5

## 2023-12-29 MED ORDER — TETANUS-DIPHTH-ACELL PERTUSSIS 5-2.5-18.5 LF-MCG/0.5 IM SUSY: 0.5000 mL | PREFILLED_SYRINGE | Freq: Once | INTRAMUSCULAR | Status: DC

## 2023-12-29 MED ORDER — PHENYLEPHRINE 80 MCG/ML (10ML) SYRINGE FOR IV PUSH (FOR BLOOD PRESSURE SUPPORT)
PREFILLED_SYRINGE | INTRAVENOUS | Status: AC
  Filled 2023-12-29: qty 10

## 2023-12-29 MED ORDER — CEFAZOLIN SODIUM-DEXTROSE 2-3 GM-%(50ML) IV SOLR
INTRAVENOUS | Status: DC | PRN
  Administered 2023-12-29: 2 g via INTRAVENOUS

## 2023-12-29 MED ORDER — DIPHENHYDRAMINE HCL 50 MG/ML IJ SOLN: 12.5000 mg | INTRAMUSCULAR | Status: DC | PRN

## 2023-12-29 MED ORDER — DEXMEDETOMIDINE HCL IN NACL 80 MCG/20ML IV SOLN
INTRAVENOUS | Status: DC | PRN
Start: 2023-12-29 — End: 2023-12-29
  Administered 2023-12-29: 8 ug via INTRAVENOUS

## 2023-12-29 MED ORDER — DIBUCAINE (PERIANAL) 1 % EX OINT: 1.0000 | TOPICAL_OINTMENT | CUTANEOUS | Status: DC | PRN

## 2023-12-29 MED ORDER — KETOROLAC TROMETHAMINE 30 MG/ML IJ SOLN
30.0000 mg | Freq: Four times a day (QID) | INTRAMUSCULAR | Status: AC
Start: 2023-12-29 — End: 2023-12-30
  Administered 2023-12-29 – 2023-12-30 (×4): 30 mg via INTRAVENOUS
  Filled 2023-12-29 (×4): qty 1

## 2023-12-29 MED ORDER — DEXAMETHASONE SODIUM PHOSPHATE 10 MG/ML IJ SOLN
INTRAMUSCULAR | Status: DC | PRN
  Administered 2023-12-29: 10 mg via INTRAVENOUS

## 2023-12-29 MED ORDER — ACETAMINOPHEN 500 MG PO TABS
1000.0000 mg | ORAL_TABLET | Freq: Four times a day (QID) | ORAL | Status: AC
  Administered 2023-12-29 – 2023-12-30 (×4): 1000 mg via ORAL
  Filled 2023-12-29 (×4): qty 2

## 2023-12-29 MED ORDER — OXYCODONE HCL 5 MG PO TABS: 5.0000 mg | ORAL_TABLET | Freq: Once | ORAL | Status: DC | PRN

## 2023-12-29 MED ORDER — SODIUM CHLORIDE 0.9 % IV SOLN
INTRAVENOUS | Status: DC | PRN
  Administered 2023-12-29: 500 mg via INTRAVENOUS

## 2023-12-29 MED ORDER — SERTRALINE HCL 100 MG PO TABS
100.0000 mg | ORAL_TABLET | Freq: Every day | ORAL | Status: DC
  Administered 2023-12-29 – 2023-12-31 (×3): 100 mg via ORAL
  Filled 2023-12-29 (×3): qty 1

## 2023-12-29 MED ORDER — STERILE WATER FOR IRRIGATION IR SOLN
Status: DC | PRN
  Administered 2023-12-29: 1

## 2023-12-29 MED ORDER — DIPHENHYDRAMINE HCL 25 MG PO CAPS: 25.0000 mg | ORAL_CAPSULE | ORAL | Status: DC | PRN

## 2023-12-29 MED ORDER — OXYCODONE HCL 5 MG/5ML PO SOLN: 5.0000 mg | Freq: Once | ORAL | Status: DC | PRN

## 2023-12-29 MED ORDER — METHYLERGONOVINE MALEATE 0.2 MG/ML IJ SOLN
INTRAMUSCULAR | Status: DC | PRN
  Administered 2023-12-29: .2 mg via INTRAMUSCULAR

## 2023-12-29 MED ORDER — SENNOSIDES-DOCUSATE SODIUM 8.6-50 MG PO TABS
2.0000 | ORAL_TABLET | Freq: Every day | ORAL | Status: DC
  Administered 2023-12-30 – 2024-01-01 (×3): 2 via ORAL
  Filled 2023-12-29 (×3): qty 2

## 2023-12-29 MED ORDER — CEFAZOLIN SODIUM-DEXTROSE 2-4 GM/100ML-% IV SOLN: 2.0000 g | INTRAVENOUS | Status: DC

## 2023-12-29 MED ORDER — MENTHOL 3 MG MT LOZG
1.0000 | LOZENGE | OROMUCOSAL | Status: DC | PRN
Start: 2023-12-29 — End: 2024-01-01

## 2023-12-29 MED ORDER — IBUPROFEN 600 MG PO TABS
600.0000 mg | ORAL_TABLET | Freq: Four times a day (QID) | ORAL | Status: DC
  Administered 2023-12-30 – 2024-01-01 (×8): 600 mg via ORAL
  Filled 2023-12-29 (×8): qty 1

## 2023-12-29 MED ORDER — GABAPENTIN 100 MG PO CAPS
200.0000 mg | ORAL_CAPSULE | Freq: Three times a day (TID) | ORAL | Status: DC
  Administered 2023-12-29 – 2024-01-01 (×8): 200 mg via ORAL
  Filled 2023-12-29 (×8): qty 2

## 2023-12-29 MED ORDER — OXYTOCIN-SODIUM CHLORIDE 30-0.9 UT/500ML-% IV SOLN
1.0000 m[IU]/min | INTRAVENOUS | Status: DC
  Administered 2023-12-29: 22 m[IU]/min via INTRAVENOUS

## 2023-12-29 MED ORDER — OXYCODONE HCL 5 MG PO TABS
5.0000 mg | ORAL_TABLET | ORAL | Status: DC | PRN
  Administered 2023-12-31 (×2): 5 mg via ORAL
  Administered 2023-12-31: 10 mg via ORAL
  Administered 2023-12-31: 5 mg via ORAL
  Filled 2023-12-29 (×3): qty 1
  Filled 2023-12-29: qty 2

## 2023-12-29 MED ORDER — LIDOCAINE-EPINEPHRINE (PF) 2 %-1:200000 IJ SOLN
INTRAMUSCULAR | Status: DC | PRN
  Administered 2023-12-29 (×4): 5 mL via EPIDURAL

## 2023-12-29 MED ORDER — DEXAMETHASONE SODIUM PHOSPHATE 10 MG/ML IJ SOLN
INTRAMUSCULAR | Status: AC
Start: 2023-12-29 — End: ?
  Filled 2023-12-29: qty 1

## 2023-12-29 MED ORDER — ACETAMINOPHEN 325 MG PO TABS
650.0000 mg | ORAL_TABLET | ORAL | Status: DC | PRN
  Administered 2023-12-31: 650 mg via ORAL
  Filled 2023-12-29: qty 2

## 2023-12-29 MED ORDER — CALCIUM CARBONATE ANTACID 500 MG PO CHEW
2.0000 | CHEWABLE_TABLET | Freq: Once | ORAL | Status: AC
  Administered 2023-12-29: 400 mg via ORAL
  Filled 2023-12-29: qty 2

## 2023-12-29 MED ORDER — TRANEXAMIC ACID-NACL 1000-0.7 MG/100ML-% IV SOLN: 1000.0000 mg | Freq: Once | INTRAVENOUS | Status: DC

## 2023-12-29 MED ORDER — ONDANSETRON HCL 4 MG/2ML IJ SOLN
INTRAMUSCULAR | Status: DC | PRN
  Administered 2023-12-29: 4 mg via INTRAVENOUS

## 2023-12-29 MED ORDER — MEASLES, MUMPS & RUBELLA VAC IJ SOLR: 0.5000 mL | Freq: Once | INTRAMUSCULAR | Status: DC

## 2023-12-29 MED ORDER — ACETAMINOPHEN 10 MG/ML IV SOLN
INTRAVENOUS | Status: DC | PRN
  Administered 2023-12-29: 1000 mg via INTRAVENOUS

## 2023-12-29 MED ORDER — PRENATAL MULTIVITAMIN CH
1.0000 | ORAL_TABLET | Freq: Every day | ORAL | Status: DC
  Administered 2023-12-30 – 2024-01-01 (×3): 1 via ORAL
  Filled 2023-12-29 (×3): qty 1

## 2023-12-29 MED ORDER — MORPHINE SULFATE (PF) 0.5 MG/ML IJ SOLN
INTRAMUSCULAR | Status: AC
  Filled 2023-12-29: qty 10

## 2023-12-29 MED ORDER — ONDANSETRON HCL 4 MG/2ML IJ SOLN: 4.0000 mg | Freq: Three times a day (TID) | INTRAMUSCULAR | Status: DC | PRN

## 2023-12-29 MED ORDER — MORPHINE SULFATE (PF) 0.5 MG/ML IJ SOLN
INTRAMUSCULAR | Status: DC | PRN
  Administered 2023-12-29: 3 mg via EPIDURAL

## 2023-12-29 SURGICAL SUPPLY — 31 items
BENZOIN TINCTURE PRP APPL 2/3 (GAUZE/BANDAGES/DRESSINGS) ×1 IMPLANT
CHLORAPREP W/TINT 26 (MISCELLANEOUS) ×2 IMPLANT
CLAMP UMBILICAL CORD (MISCELLANEOUS) ×1 IMPLANT
CLOTH BEACON ORANGE TIMEOUT ST (SAFETY) ×1 IMPLANT
DERMABOND ADVANCED .7 DNX12 (GAUZE/BANDAGES/DRESSINGS) ×1 IMPLANT
DRSG OPSITE POSTOP 4X10 (GAUZE/BANDAGES/DRESSINGS) ×1 IMPLANT
ELECTRODE REM PT RTRN 9FT ADLT (ELECTROSURGICAL) ×1 IMPLANT
EXTRACTOR VACUUM KIWI (MISCELLANEOUS) IMPLANT
GLOVE BIOGEL PI IND STRL 7.0 (GLOVE) ×3 IMPLANT
GLOVE ECLIPSE 6.5 STRL STRAW (GLOVE) ×1 IMPLANT
GOWN STRL REUS W/TWL LRG LVL3 (GOWN DISPOSABLE) ×3 IMPLANT
KIT ABG SYR 3ML LUER SLIP (SYRINGE) IMPLANT
NDL HYPO 25X5/8 SAFETYGLIDE (NEEDLE) IMPLANT
NEEDLE HYPO 25X5/8 SAFETYGLIDE (NEEDLE) IMPLANT
NS IRRIG 1000ML POUR BTL (IV SOLUTION) ×1 IMPLANT
PACK C SECTION WH (CUSTOM PROCEDURE TRAY) ×1 IMPLANT
PAD ABD 7.5X8 STRL (GAUZE/BANDAGES/DRESSINGS) ×1 IMPLANT
PAD OB MATERNITY 4.3X12.25 (PERSONAL CARE ITEMS) ×1 IMPLANT
RETRACTOR TRAXI PANNICULUS (MISCELLANEOUS) IMPLANT
RTRCTR C-SECT PINK 25CM LRG (MISCELLANEOUS) ×1 IMPLANT
STRIP CLOSURE SKIN 1/2X4 (GAUZE/BANDAGES/DRESSINGS) IMPLANT
SUT PLAIN 0 NONE (SUTURE) IMPLANT
SUT PLAIN 2 0 XLH (SUTURE) IMPLANT
SUT VIC AB 0 CT1 27XBRD ANBCTR (SUTURE) ×2 IMPLANT
SUT VIC AB 0 CTX36XBRD ANBCTRL (SUTURE) ×3 IMPLANT
SUT VIC AB 2-0 CT1 TAPERPNT 27 (SUTURE) ×1 IMPLANT
SUT VIC AB 3-0 SH 27XBRD (SUTURE) ×1 IMPLANT
SUT VIC AB 4-0 KS 27 (SUTURE) ×1 IMPLANT
TOWEL OR 17X24 6PK STRL BLUE (TOWEL DISPOSABLE) ×1 IMPLANT
TRAY FOLEY W/BAG SLVR 14FR LF (SET/KITS/TRAYS/PACK) IMPLANT
WATER STERILE IRR 1000ML POUR (IV SOLUTION) ×1 IMPLANT

## 2023-12-29 NOTE — Transfer of Care (Signed)
 Immediate Anesthesia Transfer of Care Note  Patient: Emily Banks  Procedure(s) Performed: CESAREAN DELIVERY  Patient Location: PACU  Anesthesia Type:Epidural  Level of Consciousness: awake, alert , and oriented  Airway & Oxygen Therapy: Patient Spontanous Breathing  Post-op Assessment: Report given to RN and Post -op Vital signs reviewed and stable  Post vital signs: Reviewed and stable  Last Vitals:  Vitals Value Taken Time  BP 122/80 12/29/23 1619  Temp    Pulse 69 12/29/23 1622  Resp 16 12/29/23 1622  SpO2 97 % 12/29/23 1622  Vitals shown include unfiled device data.  Last Pain:  Vitals:   12/29/23 1214  TempSrc: Axillary  PainSc:          Complications: No notable events documented.

## 2023-12-29 NOTE — Lactation Note (Signed)
 This note was copied from a baby's chart. Lactation Consultation Note  Patient Name: Emily Banks [id number] Date: 12/29/2023 Age:43 hours Reason for consult: Initial assessment;1st time breastfeeding;Early term 37-38.6wks [name] latched infant on her left breast using the football hold position with pillow support, infant breastfeed for 6 minutes and became sleepy. [name] was taught hand expression and infant was given 6 mls of colostrum by spoon. [name] knows if infant briefly latch or do not latch she can hand express and give infant EBM by spoon on Day 1 of life.  [name] will continue to breastfeed infant by cues, 8+ times within 24 hours, skin to skin. [name] knows to call for further latch assistance if needed. LC discussed infant's input and output, infant had one void diaper since birth. LC discussed importance of maternal rest, meals and hydration. [name] was  made aware of O/P services, breastfeeding support groups, community resources, and our phone # for post-discharge questions.    Maternal Data Has patient been taught Hand Expression?: Yes  Feeding Mother's Current Feeding Choice: Breast Milk  LATCH Score Latch: Grasps breast easily, tongue down, lips flanged, rhythmical sucking. (Infant fell asleep after breastfeeding for 6 minutes.)  Audible Swallowing: A few with stimulation  Type of Nipple: Everted at rest and after stimulation  Comfort (Breast/Nipple): Soft / non-tender  Hold (Positioning): Assistance needed to correctly position infant at breast and maintain latch.  LATCH Score: 8   Lactation Tools Discussed/Used    Interventions Interventions: Breast feeding basics reviewed;Assisted with latch;Skin to skin;Breast compression;Hand express;Adjust position;Support pillows;Position options;Expressed milk;Education;Guidelines for Milk Supply and Pumping Schedule Handout;LC Services brochure;CDC Guidelines for Breast Pump Cleaning;CDC milk storage guidelines  Discharge Pump:  DEBP;Personal  Consult Status Consult Status: Follow-up Date: 12/30/23 Follow-up type: In-patient    [name] 12/29/2023, 7:17 PM

## 2023-12-29 NOTE — Plan of Care (Signed)
  Problem: Education: Goal: Knowledge of General Education information will improve Description: Including pain rating scale, medication(s)/side effects and non-pharmacologic comfort measures Outcome: Progressing   Problem: Health Behavior/Discharge Planning: Goal: Ability to manage health-related needs will improve Outcome: Progressing   Problem: Clinical Measurements: Goal: Ability to maintain clinical measurements within normal limits will improve Outcome: Progressing Goal: Will remain free from infection Outcome: Progressing Goal: Diagnostic test results will improve Outcome: Progressing Goal: Respiratory complications will improve Outcome: Progressing Goal: Cardiovascular complication will be avoided Outcome: Progressing   Problem: Activity: Goal: Risk for activity intolerance will decrease Outcome: Progressing   Problem: Nutrition: Goal: Adequate nutrition will be maintained Outcome: Progressing   Problem: Coping: Goal: Level of anxiety will decrease Outcome: Progressing   Problem: Elimination: Goal: Will not experience complications related to bowel motility Outcome: Progressing Goal: Will not experience complications related to urinary retention Outcome: Progressing   Problem: Pain Managment: Goal: General experience of comfort will improve and/or be controlled Outcome: Progressing   Problem: Safety: Goal: Ability to remain free from injury will improve Outcome: Progressing   Problem: Skin Integrity: Goal: Risk for impaired skin integrity will decrease Outcome: Progressing   Problem: Education: Goal: Knowledge of Childbirth will improve Outcome: Progressing Goal: Ability to make informed decisions regarding treatment and plan of care will improve Outcome: Progressing Goal: Ability to state and carry out methods to decrease the pain will improve Outcome: Progressing Goal: Individualized Educational Video(s) Outcome: Progressing   Problem:  Coping: Goal: Ability to verbalize concerns and feelings about labor and delivery will improve Outcome: Progressing   Problem: Life Cycle: Goal: Ability to make normal progression through stages of labor will improve Outcome: Progressing Goal: Ability to effectively push during vaginal delivery will improve Outcome: Progressing   Problem: Role Relationship: Goal: Will demonstrate positive interactions with the child Outcome: Progressing   Problem: Safety: Goal: Risk of complications during labor and delivery will decrease Outcome: Progressing   Problem: Pain Management: Goal: Relief or control of pain from uterine contractions will improve Outcome: Progressing   Problem: Education: Goal: Ability to describe self-care measures that may prevent or decrease complications (Diabetes Survival Skills Education) will improve Outcome: Progressing Goal: Individualized Educational Video(s) Outcome: Progressing   Problem: Coping: Goal: Ability to adjust to condition or change in health will improve Outcome: Progressing   Problem: Fluid Volume: Goal: Ability to maintain a balanced intake and output will improve Outcome: Progressing   Problem: Health Behavior/Discharge Planning: Goal: Ability to identify and utilize available resources and services will improve Outcome: Progressing Goal: Ability to manage health-related needs will improve Outcome: Progressing   Problem: Metabolic: Goal: Ability to maintain appropriate glucose levels will improve Outcome: Progressing   Problem: Nutritional: Goal: Maintenance of adequate nutrition will improve Outcome: Progressing Goal: Progress toward achieving an optimal weight will improve Outcome: Progressing   Problem: Skin Integrity: Goal: Risk for impaired skin integrity will decrease Outcome: Progressing   Problem: Tissue Perfusion: Goal: Adequacy of tissue perfusion will improve Outcome: Progressing

## 2023-12-29 NOTE — Progress Notes (Addendum)
 Emily Banks is a 43 y.o. G2P0010 at [redacted]w[redacted]d admitted for induction of labor due to [facility] vs T2DM, AMA, IVF.  Subjective: Pt comfortable with epidural. S/O in room for support.   Objective: BP 116/84   Pulse 83   Temp 98.1 F (36.7 C) (Oral)   Resp 15   Ht 5\' 6"  (1.676 m)   Wt 117.3 kg   LMP 11/02/2022 (Exact Date)   SpO2 99%   BMI 41.74 kg/m  I/O last 3 completed shifts: In: -  Out: 600 [Urine:600] Total I/O In: -  Out: 750 [Urine:750]  FHT:  FHR: 135 bpm, variability: moderate,  accelerations:  Present,  decelerations:  Absent UC:   regular, every 10 minutes SVE:   Dilation: 3 Effacement (%): 80 Station: -2 Exam by:: Scherrie Curt, MD  Labs: Lab Results  Component Value Date   WBC 13.3 (H) 12/28/2023   HGB 12.3 12/28/2023   HCT 35.9 (L) 12/28/2023   MCV 84.5 12/28/2023   PLT 214 12/28/2023    Assessment / Plan: G2P0010 at [redacted]w[redacted]d with IOL for GDMA2 vs T2DM, AMA S/P Cytotec , foley balloon Pitocin  at 18 mu/min  Labor: Pitocin  turned off for Pit break during the night. Ctx were not adequate on 26 mu/min at that time. Restarted 2 hours later at 18mu/min and is currently on 18 with inadequate labor. FHR monitoring is reassuring. Consult Dr Ilona Malta and plan to increase Pitocin  rate to 4 x 4 to achieve adequate contractions.  Pt and husband given opportunity for questions.  Pt agrees with POC.   Preeclampsia:  labs stable Fetal Wellbeing:  Category I Pain Control:  Epidural I/D:  GBS neg Anticipated MOD:  NSVD  Arlester Bence, CNM 12/29/2023, 10:55 AM  At bedside to review management plan.  Long discussion regarding latent vs active labor.  Reviewed that with latent labor there is not always a specific time cut-off.  Discussed MVUs and ideally adequate contractions for 4-6 hours before we would anticipate cervical change.   Currently, pt does not have adequate contractions.  For now, plan will be 12hr of Pitocin , ruptured and if still no further cervical change will  discuss primary C-section for failed induction.  Questions/concerns were addressed and pt agreeable to above.  Adithya Difrancesco, DO Attending Obstetrician & Gynecologist, [facility] for [employer], [facility]

## 2023-12-29 NOTE — Discharge Summary (Signed)
 Postpartum Discharge Summary  Date of Service updated***     Patient Name: Emily Banks DOB: 12/14/1980 MRN: [id number]  Date of admission: 12/27/2023 Delivery date:12/29/2023 Delivering provider: Keene Pastures Date of discharge: 12/29/2023  Admitting diagnosis: GDM, class A2 [O24.419] Intrauterine pregnancy: 100w2d     Secondary diagnosis:  Principal Problem:   GDM, class A2  Additional problems: IVF pregnancy, GDMA2, hypothyroidism, obesity, AMA    Discharge diagnosis: Term Pregnancy Delivered and GDM A2                                              Post partum procedures:{Postpartum procedures:23558} Augmentation: AROM, Pitocin , Cytotec , and IP Foley Complications: {OB Labor/Delivery Complications:20784}  Hospital course: Induction of Labor With Cesarean Section   43 y.o. yo G2P0010 at [redacted]w[redacted]d was admitted to the hospital 12/27/2023 for induction of labor. Patient had a labor course significant for failed IOL, arrest of dilation. Induction process started with cytotec , eventually Cook balloon was placed.  She was further augmented with Pitocin  and AROM.  IUPC was placed for titration of Pitocin .  Difficulty obtaining adequate contractions and pt did not make any further cervical change past 3cm.  Despite AROM x 24hrs and Pit for more than 12 hours no further cervical change was noted.  The patient went for cesarean section due to Arrest of Dilation. Delivery details are as follows: Membrane Rupture Time/Date: 2:23 PM,12/28/2023  Delivery Method:C-Section, Low Transverse Operative Delivery:N/A Details of operation can be found in separate operative Note.  Patient had a postpartum course complicated by***. She is ambulating, tolerating a regular diet, passing flatus, and urinating well.  Patient is discharged home in stable condition on 12/29/23.      Newborn Data: Birth date:12/29/2023 Birth time:3:32 PM Gender:Female Living status:Living Apgars:8 ,9  Weight:3820 g                                Magnesium Sulfate received: {Mag received:30440022} BMZ received: {BMZ received:30440023} Rhophylac:{Rhophylac received:30440032} MMR:N/A T-DaP:Given prenatally Flu: Yes RSV Vaccine received: No Transfusion:{Transfusion received:30440034}  Immunizations received: Immunization History  Administered Date(s) Administered   Influenza, Seasonal, Injecte, Preservative Fre 09/18/2023   Influenza,inj,Quad PF,6+ Mos 09/12/2018, 04/10/2019, 07/05/2021   Moderna Sars-Covid-2 Vaccination 12/03/2019, 12/31/2019   Pneumococcal Polysaccharide-23 11/09/2021   Tdap 09/12/2018, 11/28/2023    Physical exam  Vitals:   12/29/23 1200 12/29/23 1214 12/29/23 1400 12/29/23 1430  BP: 139/78  112/85 129/76  Pulse: 89  68 73  Resp:      Temp:  98.4 F (36.9 C)    TempSrc:  Axillary    SpO2:   98% 98%  Weight:      Height:       General: {Exam; general:21111117} Lochia: {Desc; appropriate/inappropriate:30686::"appropriate"} Uterine Fundus: {Desc; firm/soft:30687} Incision: {Exam; incision:21111123} DVT Evaluation: {Exam; dvt:2111122} Labs: Lab Results  Component Value Date   WBC 13.3 (H) 12/28/2023   HGB 12.3 12/28/2023   HCT 35.9 (L) 12/28/2023   MCV 84.5 12/28/2023   PLT 214 12/28/2023      Latest Ref Rng & Units 12/27/2023   12:53 AM  CMP  Glucose 70 - 99 mg/dL 90   BUN 6 - 20 mg/dL 8   Creatinine 2.13 - 0.86 mg/dL 5.78   Sodium 469 - 629 mmol/L 136   Potassium 3.5 -  5.1 mmol/L 3.7   Chloride 98 - 111 mmol/L 108   CO2 22 - 32 mmol/L 17   Calcium 8.9 - 10.3 mg/dL 9.8   Total Protein 6.5 - 8.1 g/dL 6.1   Total Bilirubin 0.0 - 1.2 mg/dL 0.4   Alkaline Phos 38 - 126 U/L 174   AST 15 - 41 U/L 27   ALT 0 - 44 U/L 19    Edinburgh Score:     No data to display         No data recorded  After visit meds:  Allergies as of 12/29/2023   No Known Allergies   Med Rec must be completed prior to using this [facility]***        Discharge home in stable  condition Infant Feeding: {Baby feeding:23562} Infant Disposition:{CHL IP OB HOME WITH ZOXWRU:04540} Discharge instruction: per After Visit Summary and Postpartum booklet. Activity: Advance as tolerated. Pelvic rest for 6 weeks.  Diet: {OB JWJX:[id number]} Future Appointments:No future appointments. Follow up Visit:   Please schedule this patient for a In person postpartum visit in 1 week with the following provider: Any provider. Additional Postpartum F/U:Incision check 1 week  High risk pregnancy complicated by: GDMA2, hypothyroidism Delivery mode:  C-Section, Low Transverse Anticipated Birth Control:  POPs   12/29/2023 Aveleen M Ozan, DO

## 2023-12-29 NOTE — Progress Notes (Signed)
 Pharmacy sent insulin  down for patient. Patient did not have an order for insulin . States that her provider told her she would not be taking the medication  after delivery. Writer called the provider to ask if she should take the mediation . Provider states that he will call back .

## 2023-12-29 NOTE — Progress Notes (Signed)
 LABOR PROGRESS NOTE  Patient Name: Emily Banks, female   DOB: 1981-07-06, 43 y.o.  MRN: [id number]  G2P0010 at [redacted]w[redacted]d admitted for IOL due to GDMA2  S: Pt requesting cervical check/review of management plan.  No acute complaints.  O:  BP 129/76   Pulse 73   Temp 98.4 F (36.9 C) (Axillary)   Resp 15   Ht 5\' 6"  (1.676 m)   Wt 117.3 kg   LMP 11/02/2022 (Exact Date)   SpO2 98%   BMI 41.74 kg/m  EFM:130bpm/Moderate variability/ 15x15 accels/ None decels CAT: 1 Toco: MVU inadequate   CVE: Dilation: 3 Effacement (%): 80 Cervical Position: Posterior Station: -2 Presentation: Vertex Exam WU:[id number] from prior exam   A&P:   #Labor: Unchanged from prior exam.  Discussed concerns regarding arrest of dilation/failed IOL.  Questions/concerns were addressed, desires to proceed with primary C-section The risks of surgery were discussed with the patient including but were not limited to: bleeding which may require transfusion or reoperation; infection which may require antibiotics; injury to bowel, bladder, ureters or other surrounding organs; injury to the fetus; need for additional procedures including hysterectomy in the event of a life-threatening hemorrhage; formation of adhesions; placental abnormalities with subsequent pregnancies; incisional problems; thromboembolic phenomenon and other postoperative/anesthesia complications.  The patient concurred with the proposed plan, giving informed written consent for the procedure.    Anesthesia and OR aware. Preoperative prophylactic antibiotics and SCDs ordered on call to the OR.  To OR when ready.  #Pain: Epidural #FWB: CAT 1 #GBS negative #Anticipate vaginal delivery  Lajada Janes, DO Attending Obstetrician & Gynecologist, [facility] for [employer], [facility]

## 2023-12-29 NOTE — Op Note (Addendum)
 Carolanne Mercier Procedure Date: 12/29/23   PreOp Diagnosis: Arrest of dilation, failed induction of labor, A2GDM, IVF pregnancy PostOp Diagnosis: The same, viable infant delivered Procedure: Primary low transverse cesarean delivery Surgeon: Dr. Keene Pastures Assistant: Melanie Spires, MD An experienced assistant was required given the standard of surgical care given the complexity of the case.  This assistant was needed for exposure, dissection, suctioning, retraction, instrument exchange, assisting with delivery with administration of fundal pressure, and for overall help during the procedure.  Anesthesia: Anesthesiologist: Conard Decent, MD; Grace Laura, MD CRNA: Anita Kerry, CRNA  Complications: None EBL: . UOP: . Fluids: .  INDICATIONS: Arrest of dilation at 3cm, failed induction of labor.   Findings: Normal uterus and ovaries. Infant in occiput posterior position. Portion of right fallopian tube (3cm) noted, normal left fallopian tube.  Normal ovaries bilaterally  PROCEDURE:  Informed consent was obtained from the patient with risks, benefits, complications, treatment options, and expected outcomes discussed with the patient.  The patient concurred with the proposed plan, giving informed consent with form signed.   The patient was taken to Operating Room, and identified with the procedure verified as C-Section Delivery with Time Out. With induction of anesthesia, the patient was prepped and draped in the usual sterile fashion. A Pfannenstiel incision was made and carried down through the subcutaneous tissue to the fascia. The fascia was incised in the midline and extended transversely bluntly. The peritoneum was identified and entered. Peritoneal incision was extended longitudinally. Alexis retractor was placed. A low transverse uterine incision was made and the infants head delivered atraumatically. After the umbilical cord was clamped and  cut cord blood was obtained for evaluation.  The placenta was removed intact and appeared normal. The uterine outline, tubes and ovaries appeared normal. The uterine incision was closed with running locked sutures of 0 Vicryl and a second layer of the same stitch was used in an imbricating fashion.  Excellent hemostasis was obtained. Alexis retractor was removed. The peritoneum was reapproximated using 2-0 Vicryl. The fascia was then reapproximated with running sutures of 0 Vicryl. The subcutaneous tissue was reapproximated with 2-0 plain gut suture. The skin was closed with 4-0 vicryl in a subcuticular fashion.  Instrument, sponge, and needle counts were correct prior the abdominal closure and at the conclusion of the case. The patient was taken to recovery in stable condition.    Melanie Spires, MD OB Fellow, Faculty Practice [facility], Center for [facility]

## 2023-12-30 ENCOUNTER — Encounter (HOSPITAL_COMMUNITY): Payer: Self-pay | Admitting: Obstetrics & Gynecology

## 2023-12-30 LAB — CBC
HCT: 27.8 % — ABNORMAL LOW (ref 36.0–46.0)
Hemoglobin: 9.3 g/dL — ABNORMAL LOW (ref 12.0–15.0)
MCH: 28.7 pg (ref 26.0–34.0)
MCHC: 33.5 g/dL (ref 30.0–36.0)
MCV: 85.8 fL (ref 80.0–100.0)
Platelets: 164 10*3/uL (ref 150–400)
RBC: 3.24 MIL/uL — ABNORMAL LOW (ref 3.87–5.11)
RDW: 14.9 % (ref 11.5–15.5)
WBC: 14 10*3/uL — ABNORMAL HIGH (ref 4.0–10.5)
nRBC: 0 % (ref 0.0–0.2)

## 2023-12-30 LAB — GLUCOSE, CAPILLARY
Glucose-Capillary: 114 mg/dL — ABNORMAL HIGH (ref 70–99)
Glucose-Capillary: 81 mg/dL (ref 70–99)
Glucose-Capillary: 76 mg/dL (ref 70–99)
Glucose-Capillary: 95 mg/dL (ref 70–99)
Glucose-Capillary: 168 mg/dL — ABNORMAL HIGH (ref 70–99)

## 2023-12-30 MED ORDER — INSULIN ASPART 100 UNIT/ML IJ SOLN
0.0000 [IU] | Freq: Three times a day (TID) | INTRAMUSCULAR | Status: DC
  Administered 2023-12-30 – 2023-12-31 (×2): 2 [IU] via SUBCUTANEOUS

## 2023-12-30 MED ORDER — METFORMIN HCL ER 500 MG PO TB24: 500.0000 mg | ORAL_TABLET | Freq: Two times a day (BID) | ORAL | Status: DC

## 2023-12-30 MED ORDER — ENOXAPARIN SODIUM 60 MG/0.6ML IJ SOSY
60.0000 mg | PREFILLED_SYRINGE | INTRAMUSCULAR | Status: DC
  Administered 2023-12-30 – 2024-01-01 (×3): 60 mg via SUBCUTANEOUS
  Filled 2023-12-30 (×3): qty 0.6

## 2023-12-30 MED ORDER — METFORMIN HCL 500 MG PO TABS
500.0000 mg | ORAL_TABLET | Freq: Two times a day (BID) | ORAL | Status: DC
  Administered 2023-12-30 – 2024-01-01 (×5): 500 mg via ORAL
  Filled 2023-12-30 (×5): qty 1

## 2023-12-30 NOTE — Inpatient Diabetes Management (Signed)
 Inpatient Diabetes Program Recommendations  AACE/ADA: New Consensus Statement on Inpatient Glycemic Control (2015)  Target Ranges:  Prepandial:   less than 140 mg/dL      Peak postprandial:   less than 180 mg/dL (1-2 hours)      Critically ill patients:  140 - 180 mg/dL   Lab Results  Component Value Date   GLUCAP 76 12/30/2023   HGBA1C 5.5 12/10/2023    Review of Glycemic Control  Latest Reference Range & Units 12/29/23 16:30 12/30/23 01:39 12/30/23 04:09 12/30/23 08:04  Glucose-Capillary 70 - 99 mg/dL 962 (H) 952 (H) 81 76   Diabetes history: PreDiabetes  Current orders for Inpatient glycemic control:  Novolog  0-9 units tid Metformin  500 mg bid  Inpatient Diabetes Program Recommendations:    -   Discontinue Novolog  Sliding scale -   Continue CBGs achs -   Continue metformin   Thanks,  Eloise Hake RN, MSN, BC-ADM Inpatient Diabetes Coordinator Team Pager [phone] (8a-5p)

## 2023-12-30 NOTE — Lactation Note (Addendum)
 This note was copied from a baby's chart. Lactation Consultation Note  Patient Name: Emily Banks [id number] Date: 12/30/2023 Age:43 hours Reason for consult: Follow-up assessment;1st time breastfeeding;Infant weight loss (weight loss -2.88%).  [name] current feeding plan is breast feeding and supplementing infant with donor breast milk.  Infant has short feeding intervals of 5, 3 and maybe 10 minutes twice. Per [name], infant latch but not sustaining [name] is working on infant's latch. [name] has started to supplement infant with donor breast milk and pumping to help establish her milk supply and infant starts feeding better at the breast. LC changed void diaper and stool while in the room. Infant had 4 voids and 3 stools since birth. [name] informed LC she used the DEBP maybe twice. LC discussed the importance of pumping to help stimulate and establish [name] milk supply as she works toward infant is latching better at the breast. [name] wanted assistance with latching infant on her right breast due infant not wanting to latch on that side. [name] was given support pillows and [name] latched infant on her right breast using the football hold position, infant was on and off the breast and breastfeed for 9 minutes, supplemented with 2 mls of donor milk at the breast with curve tip syringe, infant consumed the remaining 8 mls on LC's gloved finger to work on infant's suck. [name] was pumping afterwards expressed 3 mls of colostrum, was still expressing colostrum from DEBP when [facility] left the room.  [name] understands that her EBM is safe for 4 hours whereas donor milk once warmed is safe for 1 hour.  [name] current feeding plan for ETI infant:  1- [name] will continue to breastfeed infant by cues, 8+ times within 24 hours, skin to skin. 2- [name] knows to continue to ask for latch assistance if needed. 3- [name] will supplement infant with her pumped EBM./ donor milk offering  (7-12 mls) or more per feeding if infant wants it using White  Nfant bottle nipple. [name] continue to supplement infant until infant is latching better at the breast and her milk supply is established. 4-[name] will continue to pump every 3 hours for 15 minutes on initial setting.    Maternal Data    Feeding Mother's Current Feeding Choice: Breast Milk and Donor Milk  LATCH Score Latch: Repeated attempts needed to sustain latch, nipple held in mouth throughout feeding, stimulation needed to elicit sucking reflex.  Audible Swallowing: A few with stimulation  Type of Nipple: Everted at rest and after stimulation  Comfort (Breast/Nipple): Soft / non-tender  Hold (Positioning): Assistance needed to correctly position infant at breast and maintain latch.  LATCH Score: 7   Lactation Tools Discussed/Used Tools: Pump;Flanges Flange Size: 24 Breast pump type: Double-Electric Breast Pump Pumping frequency: [name] will continue to pump every 3 hours for 15 minutes on inital setting. Pumped volume: 3 mL ([name] was still pumping when LC left the room.)  Interventions Interventions: Assisted with latch;Skin to skin;Breast compression;Adjust position;Support pillows;Position options;Expressed milk;Education  Discharge    Consult Status Consult Status: Follow-up Date: 12/31/23 Follow-up type: In-patient    Emily Banks 12/30/2023, 7:49 PM

## 2023-12-30 NOTE — Progress Notes (Signed)
 POSTPARTUM PROGRESS NOTE  POD #1  Subjective:  Emily Banks is a 43 y.o. G2P1011 s/p pLTCS at [redacted]w[redacted]d. No acute events overnight. She reports she is doing well. She denies any problems with po intake. Foley catheter out but has not ambulated or been able to urinate yet. Denies nausea or vomiting. She has not passed flatus. Pain is well controlled.  Lochia is appropriate.  Objective: Blood pressure 116/74, pulse 60, temperature 98.4 F (36.9 C), temperature source Oral, resp. rate 16, height 5\' 6"  (1.676 m), weight 117.3 kg, last menstrual period 11/02/2022, SpO2 98%, unknown if currently breastfeeding.  Physical Exam:  General: alert, cooperative and no distress Chest: no respiratory distress Heart: regular rate, distal pulses intact Uterine Fundus: firm, appropriately tender DVT Evaluation: No calf swelling or tenderness Extremities: trace edema Skin: warm, dry; incision clean/dry/intact w/ honeycomb dressing in place  Recent Labs    12/28/23 1428 12/30/23 0409  HGB 12.3 9.3*  HCT 35.9* 27.8*    Assessment/Plan: Emily Banks is a 43 y.o. G2P1011 s/p pLTCS at [redacted]w[redacted]d for AoDilatation.  POD#1 - Doing welll; pain well controlled. H/H appropriate  Routine postpartum care  OOB, ambulated  Lovenox for VTE prophylaxis Acute Blood Loss Anemia, clinically significant: asymptomatic  Start po ferrous sulfate every other day Hypothyroid: continue home synthroid   Contraception: none Feeding: breast  Dispo: Plan for discharge 6/11 if meeting all goals.   LOS: 3 days   Ebony Goldstein, MD OB Fellow  12/30/2023, 8:56 AM

## 2023-12-30 NOTE — Progress Notes (Signed)
 [name] was referred for history of depression/anxiety.  * Referral screened out by Clinical Social Worker because none of the following criteria appear to apply:  ~ History of anxiety/depression during this pregnancy, or of post-partum depression following prior delivery.  ~ Diagnosis of anxiety and/or depression within last 3 years  OR  * [name]'s symptoms currently being treated with medication and/or therapy.  Per OB notes, [name] has an active prescription for Zoloft  100mg  for support.  Please contact the Clinical Social Worker if needs arise, by [facility] request, or if [name] scores greater than 9/yes to question 10 on Edinburgh Postpartum Depression Screen.  Jenney Modest, Milinda Allen Clinical Social Worker [phone]

## 2023-12-30 NOTE — Anesthesia Postprocedure Evaluation (Signed)
 Anesthesia Post Note  Patient: Emily Banks  Procedure(s) Performed: CESAREAN DELIVERY     Patient location during evaluation: PACU Anesthesia Type: Epidural Level of consciousness: oriented and awake and alert Pain management: pain level controlled Vital Signs Assessment: post-procedure vital signs reviewed and stable Respiratory status: spontaneous breathing, respiratory function stable and patient connected to nasal cannula oxygen Cardiovascular status: blood pressure returned to baseline and stable Postop Assessment: no headache, no backache, no apparent nausea or vomiting and epidural receding Anesthetic complications: no  No notable events documented.  Last Vitals:  Vitals:   12/30/23 0040 12/30/23 0424  BP:  108/74  Pulse:  60  Resp: 18 18  Temp: 36.8 C 36.8 C  SpO2: 100% 100%    Last Pain:  Vitals:   12/30/23 0424  TempSrc: Oral  PainSc: 0-No pain   Pain Goal:                Epidural/Spinal Function Cutaneous sensation: Normal sensation (12/30/23 0424), Patient able to flex knees: Yes (12/30/23 0424), Patient able to lift hips off bed: Yes (12/30/23 0424), Back pain beyond tenderness at insertion site: No (12/30/23 0424), Progressively worsening motor and/or sensory loss: No (12/30/23 0424), Bowel and/or bladder incontinence post epidural: No (12/30/23 0424)  Mylez Venable L Han [place]

## 2023-12-30 NOTE — Progress Notes (Signed)
 Provider called back and states not to give night time semglee . States that he is putting orders in for metformin . And fasting blood sugar.

## 2023-12-31 LAB — GLUCOSE, CAPILLARY
Glucose-Capillary: 93 mg/dL (ref 70–99)
Glucose-Capillary: 162 mg/dL — ABNORMAL HIGH (ref 70–99)
Glucose-Capillary: 80 mg/dL (ref 70–99)

## 2023-12-31 NOTE — Lactation Note (Signed)
 This note was copied from a baby's chart. Lactation Consultation Note  Patient Name: Emily Banks NGEXB'M Date: 12/31/2023 Age:43 hours Reason for consult: Follow-up assessment;Primapara;1st time breastfeeding;Early term 37-38.6wks;Infant weight loss;Maternal endocrine disorder (infant concieved via IVF)  P1- Infant has had a total weight loss of 7.09%. [name] has been supplementing with DBM due to weight loss and [name]'s GDMA2. [name] reported that infant had just breast fed for 10 minutes, then drank 27 mL of DBM and then [name] collected 1.5 mL of EBM from pumping. LC praised [name] , infant and FOB. LC encouraged the family to continue with their feeding plan; breast, supplement and pump. [name] wanted to know how she can feed her child once discharged. LC encouraged [name] to continue offering the breast first, but reviewed how she will have to supplement with formula until her milk comes in (if she still needs to supplement.) [name] thanked [facility] for my honesty about not being able to continue with DBM from the hospital once discharged. [name] has a manual pump and hands free pump at home. Due to [name]'s medical history and the use of supplementation, LC encouraged [name] to get a DEBP as well. [name] agreed to having this LC send out a referral for a STORK pump. [name] denied having further questions or concerns at this time. LC reviewed CDC milk storage guidelines and LC services handout. LC encouraged [name] to call for further assistance as needed.  Maternal Data Has patient been taught Hand Expression?: Yes Does the patient have breastfeeding experience prior to this delivery?: No  Feeding Mother's Current Feeding Choice: Breast Milk and Donor Milk Nipple Type: Slow - flow  Lactation Tools Discussed/Used Tools: Pump;Flanges Flange Size: 24 Breast pump type: Double-Electric Breast Pump;Manual Pump Education: Setup, frequency, and cleaning;Milk Storage Reason for Pumping: infant weight loss Pumping frequency: 15-20  min every 3 hrs Pumped volume: 1.5 mL  Interventions Interventions: Breast feeding basics reviewed;Hand pump;DEBP;Education;LC Services brochure  Discharge Discharge Education: Engorgement and breast care;Warning signs for feeding baby Pump: Manual;Hands Free;Personal;Referral sent for [facility] Pump  Consult Status Consult Status: Follow-up Date: 01/01/24 Follow-up type: In-patient    Emily Banks BS, IBCLC 12/31/2023, 8:12 PM

## 2023-12-31 NOTE — Plan of Care (Signed)
 Problem: Health Behavior/Discharge Planning: Goal: Ability to manage health-related needs will improve Outcome: Progressing   Problem: Clinical Measurements: Goal: Ability to maintain clinical measurements within normal limits will improve Outcome: Progressing Goal: Will remain free from infection Outcome: Progressing Goal: Diagnostic test results will improve Outcome: Progressing Goal: Respiratory complications will improve Outcome: Progressing Goal: Cardiovascular complication will be avoided Outcome: Progressing   Problem: Activity: Goal: Risk for activity intolerance will decrease Outcome: Progressing   Problem: Nutrition: Goal: Adequate nutrition will be maintained Outcome: Progressing   Problem: Coping: Goal: Level of anxiety will decrease Outcome: Progressing   Problem: Elimination: Goal: Will not experience complications related to bowel motility Outcome: Progressing Goal: Will not experience complications related to urinary retention Outcome: Progressing   Problem: Pain Managment: Goal: General experience of comfort will improve and/or be controlled Outcome: Progressing   Problem: Safety: Goal: Ability to remain free from injury will improve Outcome: Progressing   Problem: Skin Integrity: Goal: Risk for impaired skin integrity will decrease Outcome: Progressing   Problem: Education: Goal: Knowledge of Childbirth will improve Outcome: Progressing Goal: Ability to make informed decisions regarding treatment and plan of care will improve Outcome: Progressing Goal: Ability to state and carry out methods to decrease the pain will improve Outcome: Progressing Goal: Individualized Educational Video(s) Outcome: Progressing   Problem: Coping: Goal: Ability to verbalize concerns and feelings about labor and delivery will improve Outcome: Progressing   Problem: Life Cycle: Goal: Ability to make normal progression through stages of labor will  improve Outcome: Progressing Goal: Ability to effectively push during vaginal delivery will improve Outcome: Progressing   Problem: Role Relationship: Goal: Will demonstrate positive interactions with the child Outcome: Progressing   Problem: Safety: Goal: Risk of complications during labor and delivery will decrease Outcome: Progressing   Problem: Pain Management: Goal: Relief or control of pain from uterine contractions will improve Outcome: Progressing   Problem: Education: Goal: Ability to describe self-care measures that may prevent or decrease complications (Diabetes Survival Skills Education) will improve Outcome: Progressing Goal: Individualized Educational Video(s) Outcome: Progressing   Problem: Coping: Goal: Ability to adjust to condition or change in health will improve Outcome: Progressing   Problem: Fluid Volume: Goal: Ability to maintain a balanced intake and output will improve Outcome: Progressing   Problem: Health Behavior/Discharge Planning: Goal: Ability to identify and utilize available resources and services will improve Outcome: Progressing Goal: Ability to manage health-related needs will improve Outcome: Progressing   Problem: Metabolic: Goal: Ability to maintain appropriate glucose levels will improve Outcome: Progressing   Problem: Nutritional: Goal: Maintenance of adequate nutrition will improve Outcome: Progressing Goal: Progress toward achieving an optimal weight will improve Outcome: Progressing   Problem: Skin Integrity: Goal: Risk for impaired skin integrity will decrease Outcome: Progressing   Problem: Tissue Perfusion: Goal: Adequacy of tissue perfusion will improve Outcome: Progressing   Problem: Education: Goal: Knowledge of the prescribed therapeutic regimen will improve Outcome: Progressing Goal: Understanding of sexual limitations or changes related to disease process or condition will improve Outcome:  Progressing Goal: Individualized Educational Video(s) Outcome: Progressing   Problem: Self-Concept: Goal: Communication of feelings regarding changes in body function or appearance will improve Outcome: Progressing   Problem: Skin Integrity: Goal: Demonstration of wound healing without infection will improve Outcome: Progressing   Problem: Education: Goal: Knowledge of condition will improve Outcome: Progressing Goal: Individualized Educational Video(s) Outcome: Progressing Goal: Individualized Newborn Educational Video(s) Outcome: Progressing   Problem: Activity: Goal: Will verbalize the importance of balancing activity with adequate  rest periods Outcome: Progressing Goal: Ability to tolerate increased activity will improve Outcome: Progressing

## 2023-12-31 NOTE — Progress Notes (Signed)
 POSTPARTUM PROGRESS NOTE  Subjective: Emily Banks is a 43 y.o. G2P1011 s/p pLTCS at [redacted]w[redacted]d.  She reports she is doing well. No acute events overnight. She denies any problems with ambulating, voiding or po intake. Denies nausea or vomiting. She has passed flatus. Pain is moderately controlled.  Lochia is moderate.  Objective: Blood pressure 133/70, pulse 77, temperature 98.9 F (37.2 C), temperature source Oral, resp. rate 18, height 5\' 6"  (1.676 m), weight 117.3 kg, last menstrual period 11/02/2022, SpO2 100%, unknown if currently breastfeeding.  Physical Exam:  General: alert, cooperative and no distress Chest: no respiratory distress Abdomen: soft, non-tender  Uterine Fundus: firm and at level of umbilicus Extremities: No calf swelling or tenderness  trace edema  Recent Labs    12/28/23 1428 12/30/23 0409  HGB 12.3 9.3*  HCT 35.9* 27.8*    Assessment/Plan: Rivky Clendenning is a 43 y.o. G2P1011 s/p pLTCS at [redacted]w[redacted]d for AoDil/failed IOL for A2GDM.  Routine Postpartum Care: Doing well, pain well-controlled.  -- Continue routine care, lactation support  -- Contraception: declines -- Feeding: breast  Dispo: Plan for discharge 6/11.  Maud Sorenson, MD OB Fellow 12/31/2023 1:42 PM

## 2023-12-31 NOTE — Lactation Note (Signed)
 This note was copied from a baby's chart. Lactation Consultation Note  Patient Name: Emily Banks ZHYQM'V Date: 12/31/2023 Age:43 hours   P1- [name] was asleep. LC will attempt to consult with her again at a later time.  Vernette Goo BS, IBCLC 12/31/2023, 6:34 PM

## 2023-12-31 NOTE — Plan of Care (Signed)
   Problem: Education: Goal: Knowledge of General Education information will improve Description: Including pain rating scale, medication(s)/side effects and non-pharmacologic comfort measures Outcome: Completed/Met

## 2024-01-01 ENCOUNTER — Other Ambulatory Visit

## 2024-01-01 ENCOUNTER — Other Ambulatory Visit (HOSPITAL_COMMUNITY): Payer: Self-pay

## 2024-01-01 ENCOUNTER — Ambulatory Visit

## 2024-01-01 DIAGNOSIS — O24419 Gestational diabetes mellitus in pregnancy, unspecified control: Secondary | ICD-10-CM

## 2024-01-01 LAB — GLUCOSE, CAPILLARY
Glucose-Capillary: 80 mg/dL (ref 70–99)
Glucose-Capillary: 83 mg/dL (ref 70–99)

## 2024-01-01 MED ORDER — OXYCODONE HCL 5 MG PO TABS
5.0000 mg | ORAL_TABLET | Freq: Four times a day (QID) | ORAL | 0 refills | Status: DC | PRN
  Filled 2024-01-01: qty 30, 7d supply, fill #0

## 2024-01-01 MED ORDER — WITCH HAZEL-GLYCERIN EX PADS: 1.0000 | MEDICATED_PAD | CUTANEOUS | Status: DC | PRN

## 2024-01-01 MED ORDER — GABAPENTIN 100 MG PO CAPS
200.0000 mg | ORAL_CAPSULE | Freq: Three times a day (TID) | ORAL | 0 refills | Status: DC
  Filled 2024-01-01: qty 180, 30d supply, fill #0

## 2024-01-01 MED ORDER — ACETAMINOPHEN 325 MG PO TABS: 650.0000 mg | ORAL_TABLET | ORAL | Status: DC | PRN

## 2024-01-01 MED ORDER — SENNOSIDES-DOCUSATE SODIUM 8.6-50 MG PO TABS
2.0000 | ORAL_TABLET | Freq: Every day | ORAL | 0 refills | Status: DC
  Filled 2024-01-01: qty 60, 30d supply, fill #0

## 2024-01-01 MED ORDER — IBUPROFEN 600 MG PO TABS
600.0000 mg | ORAL_TABLET | Freq: Four times a day (QID) | ORAL | 0 refills | Status: DC
  Filled 2024-01-01: qty 30, 8d supply, fill #0

## 2024-01-01 MED ORDER — FERROUS SULFATE 325 (65 FE) MG PO TABS
325.0000 mg | ORAL_TABLET | Freq: Every day | ORAL | 3 refills | Status: DC
  Filled 2024-01-01: qty 90, 90d supply, fill #0

## 2024-01-01 MED ORDER — COCONUT OIL OIL: 1.0000 | TOPICAL_OIL | Status: DC | PRN

## 2024-01-01 NOTE — Patient Instructions (Signed)
 If interested in an outpatient lactation consult in office or virtually please reach out to us  at [facility] (First Floor) [address] [place] Please call [phone] and press [phone] for lactation.    Melodi Sprung, [facility] Center for [facility]

## 2024-01-01 NOTE — Lactation Note (Signed)
 This note was copied from a baby's chart. Lactation Consultation Note  Patient Name: Emily Banks ZHYQM'V Date: 01/01/2024 Age:43 hours Reason for consult: Maternal discharge;1st time breastfeeding;Maternal endocrine disorder see MR: hx Anemia, GDM, IVF, Hypothyroidism. Infant weight loss is stabilized. today no change in weight loss in past 24 hours, maintained -7.09% weight loss.  [name] is breastfeeding and supplementing infant with formula  after feedings, [name] will continue to supplement infant until her  milk supply increases  and infant has re-gained birth weight.  LC entered the room, infant was cuing to breast feed, [name] latched infant on her left breast without latch assistance from [facility], infant was latched with depth, sustained her latch, swallows were heard of milk transfer and infant was still breastfeeding after 11 minutes when LC left the room. Per Parents, infant is now breastfeeding for 30 minutes, will latch on both breast and sustain her latch  during a feeding. Infant had 4 stools and 2 voids since 0100 am this morning, Per FOB infant's stools are now green.   Per [name], she has consistently pumped every 3 hours, starting to see few drops of colostrum when pumping.  LC discharge education: 1-LC discussed engorgement prevention and treatment,2- infant's input and output within the 1st week of life and how infant's stools will transition, 3- How to knows if breastfeeding is going well and 4- warnings signs of dehydration in infant.   Discharge Feeding Plan: 1-[name] will continue to breastfeed infant by cues, on demand, 8+ times within 24 hours, skin to skin. 2-[name] will continue to use her DEBP until her milk supply increases and infant has re-gain her birth weight, [name] will  give infant any EBM first before formula. [name] has hjandout Supplementing with Breastfeeding and knows on Day 3 to offer infant (18-25 mls per feeding) after each latch.  3-[name] has Outpatient LC services with  [facility].  Maternal Data    Feeding Mother's Current Feeding Choice: Breast Milk and Formula  LATCH Score Latch: Grasps breast easily, tongue down, lips flanged, rhythmical sucking. (areola edema is resolved)  Audible Swallowing: A few with stimulation  Type of Nipple: Everted at rest and after stimulation  Comfort (Breast/Nipple): Soft / non-tender  Hold (Positioning): No assistance needed to correctly position infant at breast.  LATCH Score: 9   Lactation Tools Discussed/Used    Interventions Interventions: Position options;DEBP;Education;Guidelines for Milk Supply and Pumping Schedule Handout;LC Services brochure;CDC milk storage guidelines;CDC Guidelines for Breast Pump Cleaning  Discharge Pump: DEBP;Hands Free;Personal ([name] informed LC she has hand pump, Spectra  DEBP plug in and Hands Free DEBP at home.)  Consult Status Consult Status: Complete Date: 01/01/24 Follow-up type: Physician    [name] 01/01/2024, 12:12 PM

## 2024-01-02 ENCOUNTER — Encounter: Admitting: Obstetrics and Gynecology

## 2024-01-02 ENCOUNTER — Other Ambulatory Visit

## 2024-01-05 ENCOUNTER — Other Ambulatory Visit: Payer: Self-pay | Admitting: Obstetrics and Gynecology

## 2024-01-06 ENCOUNTER — Ambulatory Visit: Admitting: *Deleted

## 2024-01-06 VITALS — BP 135/80 | HR 59

## 2024-01-06 DIAGNOSIS — Z98891 History of uterine scar from previous surgery: Secondary | ICD-10-CM

## 2024-01-06 NOTE — Progress Notes (Signed)
Attestation of Attending Supervision of clinical support staff: I agree with the care provided to this patient and was available for any consultation.  I have reviewed the RN's note and chart. I was available for consult and to see the patient if needed.   Liborio Saccente MD MPH Attending Physician Faculty Practice- [facility]  

## 2024-01-06 NOTE — Progress Notes (Signed)
 Subjective:     Emily Banks is a 43 y.o. female who presents to the clinic 1 weeks status post low uterine, transverse cesarean section. Pt reports incision is healing well.      Objective:    BP 135/80   Pulse (!) 59   LMP 11/02/2022 (Exact Date)  General:  alert, well appearing, in no apparent distress  Incision:   healing well, no drainage, no erythema, no hernia, no seroma, no swelling, no dehiscence, incision well approximated     Assessment:    Doing well postoperatively.   Plan:    1. Continue any current medications. 2. Wound care discussed. 3. Follow up: as needed and or at postpartum visit.   Bettey Browning, RN

## 2024-01-08 ENCOUNTER — Encounter: Admitting: Obstetrics and Gynecology

## 2024-01-09 ENCOUNTER — Other Ambulatory Visit

## 2024-01-15 ENCOUNTER — Encounter: Admitting: Certified Nurse Midwife

## 2024-01-17 ENCOUNTER — Other Ambulatory Visit: Payer: Self-pay | Admitting: Obstetrics and Gynecology

## 2024-01-17 DIAGNOSIS — E1165 Type 2 diabetes mellitus with hyperglycemia: Secondary | ICD-10-CM

## 2024-01-29 ENCOUNTER — Other Ambulatory Visit: Payer: Self-pay | Admitting: Obstetrics and Gynecology

## 2024-01-30 ENCOUNTER — Other Ambulatory Visit: Payer: Self-pay

## 2024-01-30 DIAGNOSIS — F419 Anxiety disorder, unspecified: Secondary | ICD-10-CM

## 2024-01-30 MED ORDER — SERTRALINE HCL 100 MG PO TABS: 100.0000 mg | ORAL_TABLET | Freq: Every day | ORAL | 2 refills | Status: AC

## 2024-02-10 ENCOUNTER — Encounter: Payer: Self-pay | Admitting: Family Medicine

## 2024-02-10 ENCOUNTER — Ambulatory Visit (INDEPENDENT_AMBULATORY_CARE_PROVIDER_SITE_OTHER): Admitting: Family Medicine

## 2024-02-10 DIAGNOSIS — Z8632 Personal history of gestational diabetes: Secondary | ICD-10-CM

## 2024-02-10 DIAGNOSIS — Z8639 Personal history of other endocrine, nutritional and metabolic disease: Secondary | ICD-10-CM | POA: Diagnosis not present

## 2024-02-10 DIAGNOSIS — Z30011 Encounter for initial prescription of contraceptive pills: Secondary | ICD-10-CM

## 2024-02-10 MED ORDER — JUNEL FE 24 1-20 MG-MCG(24) PO TABS
1.0000 | ORAL_TABLET | Freq: Every day | ORAL | 4 refills | Status: AC
Start: 2024-02-10 — End: ?

## 2024-02-10 NOTE — Progress Notes (Unsigned)
 Post Partum Visit Note  Emily Banks is a 43 y.o. G77P1011 female who presents for a postpartum visit. She is 6 weeks postpartum following a primary cesarean section.  I have fully reviewed the prenatal and intrapartum course. The delivery was at 37.2 gestational weeks.  Anesthesia: epidural. Postpartum course has been uncomplicated. Baby is doing well. Baby is feeding by both breast and bottle - similac 360. Bleeding no bleeding. Bowel function is normal. Bladder function is normal. Patient is sexually active. Contraception method is undecided. Postpartum depression screening: negative.  October 9th-- stopped ozempic   The pregnancy intention screening data noted above was reviewed. Potential methods of contraception were discussed. The patient elected to proceed with No data recorded.    Health Maintenance Due  Topic Date Due   OPHTHALMOLOGY EXAM  Never done   Diabetic kidney evaluation - Urine ACR  Never done   Hepatitis B Vaccines (1 of 3 - 19+ 3-dose series) Never done   HPV VACCINES (1 - 3-dose SCDM series) Never done   Pneumococcal Vaccine 72-34 Years old (2 of 2 - PCV) 11/10/2022   COVID-19 Vaccine (3 - 2024-25 season) 03/24/2023    The following portions of the patient's history were reviewed and updated as appropriate: allergies, current medications, past family history, past medical history, past social history, past surgical history, and problem list.  Review of Systems Pertinent items are noted in HPI.  Objective:  BP 127/81   Pulse 62   Wt 245 lb (111.1 kg)   LMP 11/02/2022 (Exact Date)   BMI 39.54 kg/m    General:  alert, cooperative, and appears stated age   Breasts:  not indicated  Lungs: clear to auscultation bilaterally  Heart:  regular rate and rhythm, S1, S2 normal, no murmur, click, rub or gallop  Abdomen: soft, non-tender; bowel sounds normal; no masses,  no organomegaly   Wound well approximated incision  GU exam:  not indicated        Assessment:   Normal postpartum exam.   Plan:   Essential components of care per ACOG recommendations:  1.  Mood and well being: Patient with {gen negative/positive:315881} depression screening today. Reviewed local resources for support.  - Patient tobacco use? {tobacco use:25506}  - hx of drug use? {yes/no:25505}    2. Infant care and feeding:  -Patient currently breastmilk feeding? {yes/no:25502}  -Social determinants of health (SDOH) reviewed in EPIC. No concerns***The following needs were identified***  3. Sexuality, contraception and birth spacing - Patient {DOES_DOES WNU:81435} want a pregnancy in the next year.  Desired family size is {NUMBER 1-10:22536} children.  - Reviewed reproductive life planning. Reviewed contraceptive methods based on pt preferences and effectiveness.  Patient desired {Upstream End Methods:24109} today.   - Discussed birth spacing of 18 months  4. Sleep and fatigue -Encouraged family/partner/community support of 4 hrs of uninterrupted sleep to help with mood and fatigue  5. Physical Recovery  - Discussed patients delivery and complications. She describes her labor as {description:25511} - Patient had a {CHL AMB DELIVERY:[phone]}. Patient had a {laceration:25518} laceration. Perineal healing reviewed. Patient expressed understanding - Patient has urinary incontinence? {yes/no:25515} - Patient {ACTION; IS/IS WNU:[id number]} safe to resume physical and sexual activity  6.  Health Maintenance - HM due items addressed {Yes or If no, why not?:20788} - Last pap smear  Diagnosis  Date Value Ref Range Status  11/09/2021   Final   - Negative for intraepithelial lesion or malignancy (NILM)   Pap smear {done:10129} at today's  visit.  -Breast Cancer screening indicated? {indicated:25516}  7. Chronic Disease/Pregnancy Condition follow up: {Follow up:25499}  - PCP follow up  Suzen Maryan Masters, MD Center for [facility], [facility]

## 2024-02-12 ENCOUNTER — Other Ambulatory Visit (HOSPITAL_COMMUNITY): Payer: Self-pay

## 2024-02-17 ENCOUNTER — Other Ambulatory Visit

## 2024-02-17 DIAGNOSIS — Z8632 Personal history of gestational diabetes: Secondary | ICD-10-CM | POA: Diagnosis not present

## 2024-02-17 DIAGNOSIS — Z8639 Personal history of other endocrine, nutritional and metabolic disease: Secondary | ICD-10-CM | POA: Diagnosis not present

## 2024-02-18 ENCOUNTER — Encounter: Payer: Self-pay | Admitting: Family Medicine

## 2024-02-18 ENCOUNTER — Ambulatory Visit: Payer: Self-pay | Admitting: Family Medicine

## 2024-02-18 LAB — TSH RFX ON ABNORMAL TO FREE T4: TSH: 4.19 u[IU]/mL (ref 0.450–4.500)

## 2024-02-18 LAB — GLUCOSE TOLERANCE, 2 HOURS
Glucose, 2 hour: 95 mg/dL (ref 70–139)
Glucose, GTT - Fasting: 88 mg/dL (ref 70–99)

## 2024-03-23 ENCOUNTER — Other Ambulatory Visit: Payer: Self-pay | Admitting: Obstetrics and Gynecology

## 2024-04-03 ENCOUNTER — Other Ambulatory Visit (HOSPITAL_COMMUNITY): Payer: Self-pay

## 2024-04-03 ENCOUNTER — Encounter (HOSPITAL_COMMUNITY): Payer: Self-pay | Admitting: Pharmacist

## 2024-04-03 MED ORDER — INSULIN ASPART 100 UNIT/ML FLEXPEN: 4.0000 [IU] | PEN_INJECTOR | Freq: Three times a day (TID) | SUBCUTANEOUS | 11 refills | Status: AC

## 2024-04-03 MED ORDER — INSULIN GLARGINE 100 UNIT/ML ~~LOC~~ SOLN: 20.0000 [IU] | Freq: Every day | SUBCUTANEOUS | 11 refills | Status: AC

## 2024-04-03 MED ORDER — DEXCOM G7 SENSOR MISC: 1.0000 | Freq: Every day | 3 refills | Status: AC

## 2024-04-03 MED ORDER — INSULIN DETEMIR 100 UNIT/ML ~~LOC~~ SOLN: 20.0000 [IU] | Freq: Every day | SUBCUTANEOUS | 1 refills | Status: AC

## 2024-04-03 MED ORDER — AUROVELA 24 FE 1-20 MG-MCG(24) PO TABS
1.0000 | ORAL_TABLET | Freq: Every day | ORAL | 4 refills | Status: AC
  Filled 2024-04-29: qty 84, 84d supply, fill #0
  Filled 2024-07-17: qty 84, 84d supply, fill #1

## 2024-04-03 MED ORDER — ASPIRIN 81 MG PO TBEC: 81.0000 mg | DELAYED_RELEASE_TABLET | Freq: Every day | ORAL | 2 refills | Status: AC

## 2024-04-03 MED ORDER — SERTRALINE HCL 100 MG PO TABS
100.0000 mg | ORAL_TABLET | Freq: Every day | ORAL | 2 refills | Status: AC
  Filled 2024-04-29: qty 90, 90d supply, fill #0
  Filled 2024-07-23: qty 30, 30d supply, fill #1
  Filled 2024-08-22: qty 30, 30d supply, fill #2

## 2024-04-03 MED ORDER — ESTRADIOL 0.1 MG/24HR TD PTTW: 2.0000 | MEDICATED_PATCH | TRANSDERMAL | 3 refills | Status: AC

## 2024-04-04 ENCOUNTER — Other Ambulatory Visit (HOSPITAL_COMMUNITY): Payer: Self-pay

## 2024-04-07 ENCOUNTER — Other Ambulatory Visit (HOSPITAL_COMMUNITY): Payer: Self-pay

## 2024-04-07 MED ORDER — METFORMIN HCL 1000 MG PO TABS: 1000.0000 mg | ORAL_TABLET | Freq: Two times a day (BID) | ORAL | 5 refills | Status: AC

## 2024-04-08 ENCOUNTER — Other Ambulatory Visit (HOSPITAL_COMMUNITY): Payer: Self-pay

## 2024-04-13 ENCOUNTER — Ambulatory Visit (INDEPENDENT_AMBULATORY_CARE_PROVIDER_SITE_OTHER): Admitting: Primary Care

## 2024-04-13 ENCOUNTER — Other Ambulatory Visit: Payer: Self-pay

## 2024-04-13 ENCOUNTER — Encounter: Payer: Self-pay | Admitting: Primary Care

## 2024-04-13 ENCOUNTER — Other Ambulatory Visit (HOSPITAL_COMMUNITY): Payer: Self-pay

## 2024-04-13 VITALS — BP 118/66 | HR 60 | Temp 97.8°F | Ht 66.0 in | Wt 257.0 lb

## 2024-04-13 DIAGNOSIS — G8929 Other chronic pain: Secondary | ICD-10-CM | POA: Diagnosis not present

## 2024-04-13 DIAGNOSIS — M25539 Pain in unspecified wrist: Secondary | ICD-10-CM | POA: Insufficient documentation

## 2024-04-13 DIAGNOSIS — M25531 Pain in right wrist: Secondary | ICD-10-CM | POA: Insufficient documentation

## 2024-04-13 MED ORDER — MELOXICAM 15 MG PO TABS
15.0000 mg | ORAL_TABLET | Freq: Every day | ORAL | 0 refills | Status: AC
  Filled 2024-04-13: qty 14, 14d supply, fill #0

## 2024-04-13 NOTE — Assessment & Plan Note (Addendum)
 Differentials include arthritis from overuse, carpal tunnel syndrome.  Less likely gout or RA.  Will treat with Meloxicam  15 mg daily to reduce swelling/inflammation. She will update if no improvement.   Discussed referral for steroid injection with Sports Medicine if no improvement on follow-up.  I evaluated patient, was consulted regarding treatment, and agree with assessment and plan per Adlee Paar, MSN, FNP student.   Mallie Gaskins, NP-C

## 2024-04-13 NOTE — Progress Notes (Signed)
   Acute Office Visit  Subjective:     Patient ID: Emily Banks, female    DOB: August 10, 1980, 43 y.o.   MRN: [id number]  Chief Complaint  Patient presents with   Wrist Pain    Pain in right wrist ongoing for a year, gradually worsening     Wrist Pain  Associated symptoms include tingling.   [name] is a 43 year old with a history of carpal tunnel present to the clinic for right wrist pain ongoing increasing over the last year. Surgery scheduled before COVID, and was canceled. Lateral wrist pain describe as sharp and intense with use that comes and goes. No medications tried to relieve the pain. She has used a wrist brace with little relief.   Breast feeding: 3 months and just recently stopped.    Review of Systems  Constitutional: Negative.   Eyes: Negative.   Respiratory: Negative.    Cardiovascular: Negative.   Genitourinary: Negative.   Musculoskeletal:  Positive for joint pain.  Neurological:  Positive for tingling.        Objective:    There were no vitals taken for this visit.   Physical Exam Cardiovascular:     Rate and Rhythm: Normal rate and regular rhythm.  Pulmonary:     Effort: Pulmonary effort is normal.     Breath sounds: Normal breath sounds.  Musculoskeletal:        General: Swelling and tenderness present.     Right wrist: Swelling present.     Left wrist: Normal.  Neurological:     Mental Status: She is alert.     No results found for any visits on 04/13/24.      Assessment & Plan:   Problem List Items Addressed This Visit   None   No orders of the defined types were placed in this encounter.   No follow-ups on file.  Harly Pipkins, RN

## 2024-04-13 NOTE — Progress Notes (Signed)
 Subjective:    Patient ID: Emily Banks, female    DOB: April 28, 1981, 43 y.o.   MRN: [id number]  Alleen Kehm is a very pleasant 43 y.o. female carpal tunnel syndrome who presents today to discuss wrist pain.  Her pain is located to the right lateral wrist with radiation to right first MCP and DIP joints. Symptoms began about 1 year ago. She describes her pain as intermittent, sharp, stabbing, numbness. Her pain is worse with use, improved with rest.   Chronic history of right carpal tunnel syndrome. Was scheduled to undergo surgery before Covid-19 pandemic but this was cancelled due to the pandemic.   She's tried using a brace which made her symptoms worse. She is not breastfeeding. She has noticed swelling to the first MCP joint without erythema.    Review of Systems  Constitutional:  Negative for fever.  Musculoskeletal:  Positive for arthralgias and joint swelling.  Skin:  Negative for color change.         Past Medical History:  Diagnosis Date   Abnormal uterine bleeding 10/13/2020   Anxiety and depression    Biliary colic 04/10/2019   Abd US  03/2019 - cholelithiasis, fatty liver   Carpal tunnel syndrome 04/09/2017   Dermatitis 01/03/2021   Ectopic pregnancy 2016   Elevated blood pressure reading 01/31/2016   Fatty liver    GDM, class A2 08/08/2023   Hypothyroidism    PCOS (polycystic ovarian syndrome)    Pelvic pain 10/13/2020   Pregnancy resulting from in vitro fertilization 07/25/2023   [ ]  f/u fetal echo     Type 2 diabetes mellitus (HCC)     Social History   Socioeconomic History   Marital status: Married    Spouse name: Not on file   Number of children: Not on file   Years of education: Not on file   Highest education level: Associate degree: occupational, Scientist, product/process development, or vocational program  Occupational History   Not on file  Tobacco Use   Smoking status: Former   Smokeless tobacco: Never  Vaping Use   Vaping status: Former   Start date:  06/04/2015   Substances: Nicotine  Substance and Sexual Activity   Alcohol use: Yes    Alcohol/week: 0.0 standard drinks of alcohol    Comment: soical   Drug use: Never   Sexual activity: Yes  Other Topics Concern   Not on file  Social History Narrative   Married.   Enjoys playing games, spending time with family.   Social Drivers of Corporate investment banker Strain: Low Risk  (04/09/2024)   Overall Financial Resource Strain (CARDIA)    Difficulty of Paying Living Expenses: Not hard at all  Food Insecurity: No Food Insecurity (04/09/2024)   Hunger Vital Sign    Worried About Running Out of Food in the Last Year: Never true    Ran Out of Food in the Last Year: Never true  Transportation Needs: No Transportation Needs (04/09/2024)   PRAPARE - Administrator, Civil Service (Medical): No    Lack of Transportation (Non-Medical): No  Physical Activity: Insufficiently Active (04/09/2024)   Exercise Vital Sign    Days of Exercise per Week: 7 days    Minutes of Exercise per Session: 20 min  Stress: No Stress Concern Present (04/09/2024)   [employer] of Occupational Health - Occupational Stress Questionnaire    Feeling of Stress: Only a little  Social Connections: Moderately Isolated (04/09/2024)   Social Connection and Isolation Panel  Frequency of Communication with Friends and Family: Once a week    Frequency of Social Gatherings with Friends and Family: Once a week    Attends Religious Services: 1 to 4 times per year    Active Member of [employer] or Organizations: No    Attends Banker Meetings: Not on file    Marital Status: Married  Catering manager Violence: Not At Risk (12/28/2023)   Humiliation, Afraid, Rape, and Kick questionnaire    Fear of Current or Ex-Partner: No    Emotionally Abused: No    Physically Abused: No    Sexually Abused: No    Past Surgical History:  Procedure Laterality Date   CESAREAN SECTION N/A 12/29/2023   Procedure:  CESAREAN DELIVERY;  Surgeon: Ozan, Vieva, DO;  Location: [facility];  Service: Obstetrics;  Laterality: N/A;   CHOLECYSTECTOMY N/A 06/10/2020   Procedure: LAPAROSCOPIC CHOLECYSTECTOMY;  Surgeon: Gladis Cough, MD;  Location: [facility];  Service: General;  Laterality: N/A;   DILATION AND CURETTAGE OF UTERUS      Family History  Problem Relation Age of Onset   Diabetes Father    Hypertension Father    Heart disease Father    Melanoma Father    Hyperlipidemia Mother    Diabetes Brother    Hypertension Brother    Ovarian cancer Paternal Aunt    Esophageal cancer Maternal Grandmother     No Known Allergies  Current Outpatient Medications on File Prior to Visit  Medication Sig Dispense Refill   cholecalciferol (VITAMIN D3) 25 MCG (1000 UNIT) tablet Take 1,000 Units by mouth daily.     Norethindrone  Acetate-Ethinyl Estrad-FE (AUROVELA  24 FE) 1-20 MG-MCG(24) tablet Take 1 tablet by mouth daily. 84 tablet 4   Prenatal Vit-Fe Fumarate-FA (PRENATAL MULTIVITAMIN) TABS tablet Take 1 tablet by mouth daily at 12 noon.     sertraline  (ZOLOFT ) 100 MG tablet Take 1 tablet (100 mg total) by mouth daily for anxiety or depression. 90 tablet 2   aspirin  EC 81 MG tablet Take 1 tablet (81 mg total) by mouth at bedtime. Start taking when [redacted] weeks pregnant and continue throughout the pregnancy. 300 tablet 2   Continuous Glucose Sensor (DEXCOM G7 SENSOR) MISC Use to check blood glucose every 10 days. 9 each 3   estradiol  (VIVELLE -DOT) 0.1 MG/24HR patch Place 2 patches (0.2 mg total) onto the skin and change every 3 (three) days. 16 patch 3   ibuprofen  (ADVIL ) 600 MG tablet Take 1 tablet (600 mg total) by mouth every 6 (six) hours. 30 tablet 0   insulin  aspart (NOVOLOG ) 100 UNIT/ML FlexPen Inject 4 Units into the skin 3 (three) times daily with meals. 15 mL 11   insulin  detemir (LEVEMIR ) 100 UNIT/ML injection Inject 0.2 mLs (20 Units total) into the skin at bedtime. 10 mL 1   insulin  glargine (LANTUS ) 100  UNIT/ML injection Inject 0.2 mLs (20 Units total) into the skin daily. 10 mL 11   metFORMIN  (GLUCOPHAGE ) 1000 MG tablet Take 1 tablet (1,000 mg total) by mouth 2 (two) times daily. 60 tablet 5   Norethindrone  Acetate-Ethinyl Estrad-FE (JUNEL  FE 24) 1-20 MG-MCG(24) tablet Take 1 tablet by mouth daily. 84 tablet 4   sertraline  (ZOLOFT ) 100 MG tablet Take 1 tablet (100 mg total) by mouth daily. For anxiety and depression. 90 tablet 2   No current facility-administered medications on file prior to visit.    There were no vitals taken for this visit. Objective:   Physical Exam Cardiovascular:  Rate and Rhythm: Normal rate.  Pulmonary:     Effort: Pulmonary effort is normal.  Musculoskeletal:     Right hand: Swelling present. No bony tenderness. Normal range of motion.       Hands:     Comments: Movement pain to right first MCP joint  Skin:    General: Skin is warm and dry.     Findings: No erythema.     Physical Exam        Assessment & Plan:  There are no diagnoses linked to this encounter.  Assessment and Plan Assessment & Plan         Comer MARLA Gaskins, NP    History of Present Illness

## 2024-04-13 NOTE — Patient Instructions (Addendum)
 Start taking one Meloxicam  15 mg tablet with food daily for 14 days.  Please up date in 2 weeks if no improvement.   It was a pleasure to see you today!

## 2024-04-29 ENCOUNTER — Other Ambulatory Visit: Payer: Self-pay

## 2024-04-29 ENCOUNTER — Other Ambulatory Visit (HOSPITAL_COMMUNITY): Payer: Self-pay

## 2024-05-28 NOTE — Progress Notes (Signed)
 Emily Banks                                          MRN: [id number]   05/28/2024   The [facility] Quality Team Specialist reviewed this patient medical record for the purposes of chart review for care gap closure. The following were reviewed: chart review for care gap closure-kidney health evaluation for diabetes:eGFR  and uACR.    [facility] Quality Team

## 2024-07-17 ENCOUNTER — Other Ambulatory Visit (HOSPITAL_COMMUNITY): Payer: Self-pay

## 2024-07-23 ENCOUNTER — Other Ambulatory Visit (HOSPITAL_COMMUNITY): Payer: Self-pay

## 2024-08-22 ENCOUNTER — Other Ambulatory Visit (HOSPITAL_COMMUNITY): Payer: Self-pay
